# Patient Record
Sex: Male | Born: 1980 | Race: Black or African American | Hispanic: No | Marital: Single | Smoking: Current every day smoker
Health system: Southern US, Community
[De-identification: ages and names within clinical notes are randomized; demographics above are authoritative.]

## PROBLEM LIST (undated history)

## (undated) DIAGNOSIS — M1990 Unspecified osteoarthritis, unspecified site: Secondary | ICD-10-CM

## (undated) DIAGNOSIS — K219 Gastro-esophageal reflux disease without esophagitis: Secondary | ICD-10-CM

## (undated) HISTORY — PX: ANKLE SURGERY: SHX546

---

## 2005-10-05 ENCOUNTER — Emergency Department: Payer: Self-pay | Admitting: Emergency Medicine

## 2007-03-22 ENCOUNTER — Emergency Department: Payer: Self-pay | Admitting: Emergency Medicine

## 2009-02-25 ENCOUNTER — Emergency Department: Payer: Self-pay | Admitting: Unknown Physician Specialty

## 2010-10-01 ENCOUNTER — Emergency Department: Payer: Self-pay | Admitting: Emergency Medicine

## 2013-02-27 ENCOUNTER — Emergency Department: Payer: Self-pay | Admitting: Emergency Medicine

## 2013-02-27 LAB — URINALYSIS, COMPLETE
Bilirubin,UR: NEGATIVE
Ketone: NEGATIVE
Nitrite: NEGATIVE
Ph: 5 (ref 4.5–8.0)
RBC,UR: 461 /HPF (ref 0–5)
Specific Gravity: 1.023 (ref 1.003–1.030)
Squamous Epithelial: 2
WBC UR: 36 /HPF (ref 0–5)

## 2013-02-27 LAB — GC/CHLAMYDIA PROBE AMP

## 2013-08-07 ENCOUNTER — Emergency Department: Payer: Self-pay | Admitting: Emergency Medicine

## 2013-10-24 ENCOUNTER — Emergency Department: Payer: Self-pay | Admitting: Emergency Medicine

## 2014-03-20 ENCOUNTER — Emergency Department: Payer: Self-pay | Admitting: Emergency Medicine

## 2015-06-01 ENCOUNTER — Encounter: Payer: Self-pay | Admitting: *Deleted

## 2015-06-01 ENCOUNTER — Emergency Department
Admission: EM | Admit: 2015-06-01 | Discharge: 2015-06-01 | Disposition: A | Discharge status: Home / Self Care | Payer: Self-pay | Attending: Emergency Medicine | Admitting: Emergency Medicine

## 2015-06-01 DIAGNOSIS — K047 Periapical abscess without sinus: Secondary | ICD-10-CM | POA: Insufficient documentation

## 2015-06-01 DIAGNOSIS — K029 Dental caries, unspecified: Secondary | ICD-10-CM | POA: Insufficient documentation

## 2015-06-01 MED ORDER — AMOXICILLIN 875 MG PO TABS: 875.0000 mg | ORAL_TABLET | Freq: Two times a day (BID) | ORAL | Status: DC

## 2015-06-01 MED ORDER — MAGIC MOUTHWASH W/LIDOCAINE: 5.0000 mL | Freq: Four times a day (QID) | ORAL | Status: DC

## 2015-06-01 MED ORDER — LIDOCAINE-EPINEPHRINE 2 %-1:100000 IJ SOLN
1.7000 mL | Freq: Once | INTRAMUSCULAR | Status: DC
  Filled 2015-06-01: qty 1.7

## 2015-06-01 NOTE — ED Provider Notes (Signed)
[facility] Emergency Department Provider Note  ____________________________________________  Time seen: Approximately 11:03 AM  I have reviewed the triage vital signs and the nursing notes.   HISTORY  Chief Complaint Dental Pain    HPI Juan Best is a 35 y.o. male who presents emergency department complaining of left lower dental pain. Patient states that he has a "abscess" to that area. Patient does not have a dentist and has not taken any medications prior to arrival. Patient denies any fevers or chills, difficulty breathing or swallowing, headache, sore throat.    History reviewed. No pertinent past medical history.  There are no active problems to display for this patient.   No past surgical history on file.  Current Outpatient Rx  Name  Route  Sig  Dispense  Refill  . amoxicillin (AMOXIL) 875 MG tablet   Oral   Take 1 tablet (875 mg total) by mouth 2 (two) times daily.   14 tablet   0   . magic mouthwash w/lidocaine SOLN   Oral   Take 5 mLs by mouth 4 (four) times daily.   240 mL   0     Dispense in a 1/1/1/1 ratio. Use lidocaine, diphen ...     Allergies Sulfa antibiotics  History reviewed. No pertinent family history.  Social History Social History  Substance Use Topics  . Smoking status: None  . Smokeless tobacco: None  . Alcohol Use: None     Review of Systems  Constitutional: No fever/chills Eyes: No visual changes.  ENT: No sore throat. Positive for left lower dental pain. Cardiovascular: no chest pain. Respiratory: no cough. No SOB. Gastrointestinal: No abdominal pain.  No nausea, no vomiting.   Musculoskeletal: Negative for back pain. Skin: Negative for rash. Neurological: Negative for headaches, focal weakness or numbness. 10-point ROS otherwise negative.  ____________________________________________   PHYSICAL EXAM:  VITAL SIGNS: ED Triage Vitals  Enc Vitals Group     BP 06/01/15 1025 113/80  mmHg     Pulse Rate 06/01/15 1025 58     Resp 06/01/15 1025 18     Temp 06/01/15 1025 98.1 F (36.7 C)     Temp Source 06/01/15 1025 Oral     SpO2 06/01/15 1025 98 %     Weight 06/01/15 1025 140 lb (63.504 kg)     Height 06/01/15 1025  (1.753 m)     Head Cir --      Peak Flow --      Pain Score 06/01/15 1025 10     Pain Loc --      Pain Edu? --      Excl. in GC? --      Constitutional: Alert and oriented. Well appearing and in no acute distress. Eyes: Conjunctivae are normal. PERRL. EOMI. Head: Atraumatic. ENT:      Ears:       Nose: No congestion/rhinnorhea.      Mouth/Throat: Mucous membranes are moist. Poor dentition throughout with multiple caries identified. There is erythema and edema surrounding the #20 tooth. There is no fluctuance to palpation with tongue depressor. No drainage noted. Neck: No stridor.   Hematological/Lymphatic/Immunilogical: No cervical lymphadenopathy. Cardiovascular: Normal rate, regular rhythm. Normal S1 and S2.  Good peripheral circulation. Respiratory: Normal respiratory effort without tachypnea or retractions. Lungs CTAB. Neurologic:  Normal speech and language. No gross focal neurologic deficits are appreciated.  Skin:  Skin is warm, dry and intact. No rash noted. Psychiatric: Mood and affect are normal.  Speech and behavior are normal. Patient exhibits appropriate insight and judgement.   ____________________________________________   LABS (all labs ordered are listed, but only abnormal results are displayed)  Labs Reviewed - No data to display ____________________________________________  EKG   ____________________________________________  RADIOLOGY   No results found.  ____________________________________________    PROCEDURES  Procedure(s) performed:       Medications  lidocaine-EPINEPHrine (XYLOCAINE W/EPI) 2 %-1:100000 (with pres) injection 1.7 mL (not administered)      ____________________________________________   INITIAL IMPRESSION / ASSESSMENT AND PLAN / ED COURSE  Pertinent labs & imaging results that were available during my care of the patient were reviewed by me and considered in my medical decision making (see chart for details).  Patient's diagnosis is consistent with dental abscess. Patient will be discharged home with prescriptions for antibiotics and mouthwash. Patient is to follow up [facility] if symptoms persist past this treatment course. Patient is given ED precautions to return to the ED for any worsening or new symptoms.     ____________________________________________  FINAL CLINICAL IMPRESSION(S) / ED DIAGNOSES  Final diagnoses:  Dental abscess  Dental caries      NEW MEDICATIONS STARTED DURING THIS VISIT:  New Prescriptions   AMOXICILLIN (AMOXIL) 875 MG TABLET    Take 1 tablet (875 mg total) by mouth 2 (two) times daily.   MAGIC MOUTHWASH W/LIDOCAINE SOLN    Take 5 mLs by mouth 4 (four) times daily.        Delorise Royals Cuthriell, PA-C 06/01/15 1128  Jeanmarie Plant, MD 06/01/15 [phone]

## 2015-06-01 NOTE — Discharge Instructions (Signed)
Dental Abscess °A dental abscess is a collection of pus in or around a tooth. °CAUSES °This condition is caused by a bacterial infection around the root of the tooth that involves the inner part of the tooth (pulp). It may result from: °· Severe tooth decay. °· Trauma to the tooth that allows bacteria to enter into the pulp, such as a broken or chipped tooth. °· Severe gum disease around a tooth. °SYMPTOMS °Symptoms of this condition include: °· Severe pain in and around the infected tooth. °· Swelling and redness around the infected tooth, in the mouth, or in the face. °· Tenderness. °· Pus drainage. °· Bad breath. °· Bitter taste in the mouth. °· Difficulty swallowing. °· Difficulty opening the mouth. °· Nausea. °· Vomiting. °· Chills. °· Swollen neck glands. °· Fever. °DIAGNOSIS °This condition is diagnosed with examination of the infected tooth. During the exam, your dentist may tap on the infected tooth. Your dentist will also ask about your medical and dental history and may order X-rays. °TREATMENT °This condition is treated by eliminating the infection. This may be done with: °· Antibiotic medicine. °· A root canal. This may be performed to save the tooth. °· Pulling (extracting) the tooth. This may also involve draining the abscess. This is done if the tooth cannot be saved. °HOME CARE INSTRUCTIONS °· Take medicines only as directed by your dentist. °· If you were prescribed antibiotic medicine, finish all of it even if you start to feel better. °· Rinse your mouth (gargle) often with salt water to relieve pain or swelling. °· Do not drive or operate heavy machinery while taking pain medicine. °· Do not apply heat to the outside of your mouth. °· Keep all follow-up visits as directed by your dentist. This is important. °SEEK MEDICAL CARE IF: °· Your pain is worse and is not helped by medicine. °SEEK IMMEDIATE MEDICAL CARE IF: °· You have a fever or chills. °· Your symptoms suddenly get worse. °· You have a  very bad headache. °· You have problems breathing or swallowing. °· You have trouble opening your mouth. °· You have swelling in your neck or around your eye. °  °This information is not intended to replace advice given to you by your health care provider. Make sure you discuss any questions you have with your health care provider. °  °Document Released: 04/14/2005 Document Revised: 08/29/2014 Document Reviewed: 04/11/2014 °Elsevier Interactive Patient Education ©2016 [employer] ° °Dental Care and Dentist Visits °Dental care supports good overall health. Regular dental visits can also help you avoid dental pain, bleeding, infection, and other more serious health problems in the future. It is important to keep the mouth healthy because diseases in the teeth, gums, and other oral tissues can spread to other areas of the body. Some problems, such as diabetes, heart disease, and pre-term labor have been associated with poor oral health.  °See your dentist every 6 months. If you experience emergency problems such as a toothache or broken tooth, go to the dentist right away. If you see your dentist regularly, you may catch problems early. It is easier to be treated for problems in the early stages.  °WHAT TO EXPECT AT A DENTIST VISIT  °Your dentist will look for many common oral health problems and recommend proper treatment. At your regular dental visit, you can expect: °· Gentle cleaning of the teeth and gums. This includes scraping and polishing. This helps to remove the sticky substance around the teeth and gums (  plaque). Plaque forms in the mouth shortly after eating. Over time, plaque hardens on the teeth as tartar. If tartar is not removed regularly, it can cause problems. Cleaning also helps remove stains. °· Periodic X-rays. These pictures of the teeth and supporting bone will help your dentist assess the health of your teeth. °· Periodic fluoride treatments. Fluoride is a natural mineral shown to help  strengthen teeth. Fluoride treatment involves applying a fluoride gel or varnish to the teeth. It is most commonly done in children. °· Examination of the mouth, tongue, jaws, teeth, and gums to look for any oral health problems, such as: °¨ Cavities (dental caries). This is decay on the tooth caused by plaque, sugar, and acid in the mouth. It is best to catch a cavity when it is small. °¨ Inflammation of the gums caused by plaque buildup (gingivitis). °¨ Problems with the mouth or malformed or misaligned teeth. °¨ Oral cancer or other diseases of the soft tissues or jaws.  °KEEP YOUR TEETH AND GUMS HEALTHY °For healthy teeth and gums, follow these general guidelines as well as your dentist's specific advice: °· Have your teeth professionally cleaned at the dentist every 6 months. °· Brush twice daily with a fluoride toothpaste. °· Floss your teeth daily.  °· Ask your dentist if you need fluoride supplements, treatments, or fluoride toothpaste. °· Eat a healthy diet. Reduce foods and drinks with added sugar. °· Avoid smoking. °TREATMENT FOR ORAL HEALTH PROBLEMS °If you have oral health problems, treatment varies depending on the conditions present in your teeth and gums. °· Your caregiver will most likely recommend good oral hygiene at each visit. °· For cavities, gingivitis, or other oral health disease, your caregiver will perform a procedure to treat the problem. This is typically done at a separate appointment. Sometimes your caregiver will refer you to another dental specialist for specific tooth problems or for surgery. °SEEK IMMEDIATE DENTAL CARE IF: °· You have pain, bleeding, or soreness in the gum, tooth, jaw, or mouth area. °· A permanent tooth becomes loose or separated from the gum socket. °· You experience a blow or injury to the mouth or jaw area. °  °This information is not intended to replace advice given to you by your health care provider. Make sure you discuss any questions you have with your  health care provider. °  °Document Released: 12/25/2010 Document Revised: 07/07/2011 Document Reviewed: 12/25/2010 °Elsevier Interactive Patient Education ©2016 [employer] ° °

## 2015-06-01 NOTE — ED Notes (Signed)
Pt states dental abscess on the left side of his mouth

## 2015-07-09 ENCOUNTER — Encounter: Payer: Self-pay | Admitting: *Deleted

## 2015-07-09 ENCOUNTER — Emergency Department
Admission: EM | Admit: 2015-07-09 | Discharge: 2015-07-09 | Disposition: A | Discharge status: Home / Self Care | Payer: Self-pay | Attending: Emergency Medicine | Admitting: Emergency Medicine

## 2015-07-09 DIAGNOSIS — J069 Acute upper respiratory infection, unspecified: Secondary | ICD-10-CM | POA: Insufficient documentation

## 2015-07-09 DIAGNOSIS — A084 Viral intestinal infection, unspecified: Secondary | ICD-10-CM | POA: Insufficient documentation

## 2015-07-09 DIAGNOSIS — F17210 Nicotine dependence, cigarettes, uncomplicated: Secondary | ICD-10-CM | POA: Insufficient documentation

## 2015-07-09 LAB — RAPID INFLUENZA A&B ANTIGENS: Influenza A (ARMC): NEGATIVE

## 2015-07-09 LAB — RAPID INFLUENZA A&B ANTIGENS (ARMC ONLY): INFLUENZA B (ARMC): NEGATIVE

## 2015-07-09 MED ORDER — PROMETHAZINE-DM 6.25-15 MG/5ML PO SYRP: 5.0000 mL | ORAL_SOLUTION | Freq: Four times a day (QID) | ORAL | Status: DC | PRN

## 2015-07-09 MED ORDER — ONDANSETRON 4 MG PO TBDP
4.0000 mg | ORAL_TABLET | Freq: Once | ORAL | Status: AC
  Administered 2015-07-09: 4 mg via ORAL
  Filled 2015-07-09: qty 1

## 2015-07-09 NOTE — ED Notes (Signed)
Strep negative.

## 2015-07-09 NOTE — ED Provider Notes (Signed)
[facility] Emergency Department Provider Note  ____________________________________________  Time seen: Approximately 11:19 AM  I have reviewed the triage vital signs and the nursing notes.   HISTORY  Chief Complaint Emesis    HPI Juan Best is a 35 y.o. male , NAD, presents to the emergency department with a history of nausea and vomiting. Has had some cough and congestion. She is been exposed to children who have had a stomach virus over the last week. Is not taking anything over-the-counter for his current symptoms. No change in changes in urination. No chest or back pain. Has not had any headaches, shortness of breath, wheezing. Has been able to eat and drink well. Does note he has a physically intensive job and needs a work note for today.   History reviewed. No pertinent past medical history.  There are no active problems to display for this patient.   History reviewed. No pertinent past surgical history.  Current Outpatient Rx  Name  Route  Sig  Dispense  Refill  . promethazine-dextromethorphan (PROMETHAZINE-DM) 6.25-15 MG/5ML syrup   Oral   Take 5 mLs by mouth 4 (four) times daily as needed for cough.   118 mL   0     Allergies Sulfa antibiotics  No family history on file.  Social History Social History  Substance Use Topics  . Smoking status: Current Every Day Smoker -- 0.50 packs/day    Types: Cigarettes  . Smokeless tobacco: None  . Alcohol Use: No     Review of Systems  Constitutional: Positive fatigue. No fever/chills Eyes: No visual changes. No discharge ENT: No sore throat or nasal congestion, runny nose, ear pain. Cardiovascular: No chest pain. Respiratory: Positive chest congestion, cough. No shortness of breath. No wheezing.  Gastrointestinal: Positive nausea, vomiting. No abdominal pain.   No diarrhea.  No constipation. Genitourinary: Negative for dysuria.  Musculoskeletal: Negative for back pain.  Skin:  Negative for rash. Neurological: Negative for headaches, focal weakness or numbness. 10-point ROS otherwise negative.  ____________________________________________   PHYSICAL EXAM:  VITAL SIGNS: ED Triage Vitals  Enc Vitals Group     BP 07/09/15 1044 121/75 mmHg     Pulse Rate 07/09/15 1044 110     Resp 07/09/15 1044 22     Temp 07/09/15 1044 100.9 F (38.3 C)     Temp Source 07/09/15 1044 Oral     SpO2 07/09/15 1044 96 %     Weight 07/09/15 1044 140 lb (63.504 kg)     Height 07/09/15 1044  (1.753 m)     Head Cir --      Peak Flow --      Pain Score --      Pain Loc --      Pain Edu? --      Excl. in GC? --     Constitutional: Alert and oriented. Well appearing and in no acute distress. Eyes: Conjunctivae are normal.  Head: Atraumatic. ENT:      Ears: TMs visualized bilaterally without effusion, erythema, bulging, perforation.      Nose: Mild congestion/rhinnorhea.      Mouth/Throat: Mucous membranes are moist. Pharynx with trace injection but no erythema, exudate, swelling. Neck: Upper with full range of motion. Hematological/Lymphatic/Immunilogical: No cervical lymphadenopathy. Cardiovascular: Normal rate, regular rhythm. Normal S1 and S2.  Good peripheral circulation. Respiratory: Normal respiratory effort without tachypnea or retractions. Lungs CTAB. Gastrointestinal: Soft and nontender in all quadrants. No distention, guarding. Bowel sounds grossly normal in  all quadrants. Neurologic:  Normal speech and language. No gross focal neurologic deficits are appreciated.  Skin:  Skin turgor is normal. Skin is warm, dry and intact. No rash noted. Psychiatric: Mood and affect are normal. Speech and behavior are normal. Patient exhibits appropriate insight and judgement.   ____________________________________________   LABS (all labs ordered are listed, but only abnormal results are displayed)  Labs Reviewed  RAPID INFLUENZA A&B ANTIGENS ([facility] ONLY)    ____________________________________________  EKG  None ____________________________________________  RADIOLOGY  None ____________________________________________    PROCEDURES  Procedure(s) performed: None    Medications  ondansetron (ZOFRAN-ODT) disintegrating tablet 4 mg (4 mg Oral Given 07/09/15 1127)     ____________________________________________   INITIAL IMPRESSION / ASSESSMENT AND PLAN / ED COURSE  Pertinent lab results that were available during my care of the patient were reviewed by me and considered in my medical decision making (see chart for details).  Patient's diagnosis is consistent with viral gastroenteritis and viral respiratory infection. Patient will be discharged home with prescriptions for promethazine DM cough syrup to take as directed. May take over-the-counter Tylenol as needed. Patient was given a work note to excuse for today. Advise patient to drink water and Gatorade through the day to increase hydration. [facility] if symptoms persist past this treatment course. Patient is given ED precautions to return to the ED for any worsening or new symptoms.    ____________________________________________  FINAL CLINICAL IMPRESSION(S) / ED DIAGNOSES  Final diagnoses:  Viral gastroenteritis  Viral upper respiratory infection      NEW MEDICATIONS STARTED DURING THIS VISIT:  New Prescriptions   PROMETHAZINE-DEXTROMETHORPHAN (PROMETHAZINE-DM) 6.25-15 MG/5ML SYRUP    Take 5 mLs by mouth 4 (four) times daily as needed for cough.         Hope PigeonJami L Hagler, PA-C 07/09/15 1215  Jene Everyobert Kinner, MD 07/09/15 1215

## 2015-07-09 NOTE — ED Notes (Signed)
Pt reports vomiting four times since today at 0300, pt reports chills and children have the flu

## 2015-07-09 NOTE — Discharge Instructions (Signed)
Food Choices to Help Relieve Diarrhea, Adult °When you have diarrhea, the foods you eat and your eating habits are very important. Choosing the right foods and drinks can help relieve diarrhea. Also, because diarrhea can last up to 7 days, you need to replace lost fluids and electrolytes (such as sodium, potassium, and chloride) in order to help prevent dehydration.  °WHAT GENERAL GUIDELINES DO I NEED TO FOLLOW? °· Slowly drink 1 cup (8 oz) of fluid for each episode of diarrhea. If you are getting enough fluid, your urine will be clear or pale yellow. °· Eat starchy foods. Some good choices include white rice, white toast, pasta, low-fiber cereal, baked potatoes (without the skin), saltine crackers, and bagels. °· Avoid large servings of any cooked vegetables. °· Limit fruit to two servings per day. A serving is ½ cup or 1 small piece. °· Choose foods with less than 2 g of fiber per serving. °· Limit fats to less than 8 tsp (38 g) per day. °· Avoid fried foods. °· Eat foods that have probiotics in them. Probiotics can be found in certain dairy products. °· Avoid foods and beverages that may increase the speed at which food moves through the stomach and intestines (gastrointestinal tract). Things to avoid include: °· High-fiber foods, such as dried fruit, raw fruits and vegetables, nuts, seeds, and whole grain foods. °· Spicy foods and high-fat foods. °· Foods and beverages sweetened with high-fructose corn syrup, honey, or sugar alcohols such as xylitol, sorbitol, and mannitol. °WHAT FOODS ARE RECOMMENDED? °Grains °White rice. White, French, or pita breads (fresh or toasted), including plain rolls, buns, or bagels. White pasta. Saltine, soda, or graham crackers. Pretzels. Low-fiber cereal. Cooked cereals made with water (such as cornmeal, farina, or cream cereals). Plain muffins. Matzo. Melba toast. Zwieback.  °Vegetables °Potatoes (without the skin). Strained tomato and vegetable juices. Most well-cooked and canned  vegetables without seeds. Tender lettuce. °Fruits °Cooked or canned applesauce, apricots, cherries, fruit cocktail, grapefruit, peaches, pears, or plums. Fresh bananas, apples without skin, cherries, grapes, cantaloupe, grapefruit, peaches, oranges, or plums.  °Meat and Other Protein Products °Baked or boiled chicken. Eggs. Tofu. Fish. Seafood. Smooth peanut butter. Ground or well-cooked tender beef, ham, veal, lamb, pork, or poultry.  °Dairy °Plain yogurt, kefir, and unsweetened liquid yogurt. Lactose-free milk, buttermilk, or soy milk. Plain hard cheese. °Beverages °Sport drinks. Clear broths. Diluted fruit juices (except prune). Regular, caffeine-free sodas such as ginger ale. Water. Decaffeinated teas. Oral rehydration solutions. Sugar-free beverages not sweetened with sugar alcohols. °Other °Bouillon, broth, or soups made from recommended foods.  °The items listed above may not be a complete list of recommended foods or beverages. Contact your dietitian for more options. °WHAT FOODS ARE NOT RECOMMENDED? °Grains °Whole grain, whole wheat, bran, or rye breads, rolls, pastas, crackers, and cereals. Wild or brown rice. Cereals that contain more than 2 g of fiber per serving. Corn tortillas or taco shells. Cooked or dry oatmeal. Granola. Popcorn. °Vegetables °Raw vegetables. Cabbage, broccoli, Brussels sprouts, artichokes, baked beans, beet greens, corn, kale, legumes, peas, sweet potatoes, and yams. Potato skins. Cooked spinach and cabbage. °Fruits °Dried fruit, including raisins and dates. Raw fruits. Stewed or dried prunes. Fresh apples with skin, apricots, mangoes, pears, raspberries, and strawberries.  °Meat and Other Protein Products °Chunky peanut butter. Nuts and seeds. Beans and lentils. Bacon.  °Dairy °High-fat cheeses. Milk, chocolate milk, and beverages made with milk, such as milk shakes. Cream. Ice cream. °Sweets and Desserts °Sweet rolls, doughnuts, and sweet breads.   Pancakes and waffles. Fats and  Oils Butter. Cream sauces. Margarine. Salad oils. Plain salad dressings. Olives. Avocados.  Beverages Caffeinated beverages (such as coffee, tea, soda, or energy drinks). Alcoholic beverages. Fruit juices with pulp. Prune juice. Soft drinks sweetened with high-fructose corn syrup or sugar alcohols. Other Coconut. Hot sauce. Chili powder. Mayonnaise. Gravy. Cream-based or milk-based soups.  The items listed above may not be a complete list of foods and beverages to avoid. Contact your dietitian for more information. WHAT SHOULD I DO IF I BECOME DEHYDRATED? Diarrhea can sometimes lead to dehydration. Signs of dehydration include dark urine and dry mouth and skin. If you think you are dehydrated, you should rehydrate with an oral rehydration solution. These solutions can be purchased at pharmacies, retail stores, or online.  Drink -1 cup (120-240 mL) of oral rehydration solution each time you have an episode of diarrhea. If drinking this amount makes your diarrhea worse, try drinking smaller amounts more often. For example, drink 1-3 tsp (5-15 mL) every 5-10 minutes.  A general rule for staying hydrated is to drink 1-2 L of fluid per day. Talk to your health care provider about the specific amount you should be drinking each day. Drink enough fluids to keep your urine clear or pale yellow.   This information is not intended to replace advice given to you by your health care provider. Make sure you discuss any questions you have with your health care provider.   Document Released: 07/05/2003 Document Revised: 05/05/2014 Document Reviewed: 03/07/2013 Elsevier Interactive Patient Education 2016 [employer] Inc[employer]  Viral Gastroenteritis Viral gastroenteritis is also called stomach flu. This illness is caused by a certain type of germ (virus). It can cause sudden watery poop (diarrhea) and throwing up (vomiting). This can cause you to lose body fluids (dehydration). This illness usually lasts for 3 to 8  days. It usually goes away on its own. HOME CARE   Drink enough fluids to keep your pee (urine) clear or pale yellow. Drink small amounts of fluids often.  Ask your doctor how to replace body fluid losses (rehydration).  Avoid:  Foods high in sugar.  Alcohol.  Bubbly (carbonated) drinks.  Tobacco.  Juice.  Caffeine drinks.  Very hot or cold fluids.  Fatty, greasy foods.  Eating too much at one time.  Dairy products until 24 to 48 hours after your watery poop stops.  You may eat foods with active cultures (probiotics). They can be found in some yogurts and supplements.  Wash your hands well to avoid spreading the illness.  Only take medicines as told by your doctor. Do not give aspirin to children. Do not take medicines for watery poop (antidiarrheals).  Ask your doctor if you should keep taking your regular medicines.  Keep all doctor visits as told. GET HELP RIGHT AWAY IF:   You cannot keep fluids down.  You do not pee at least once every 6 to 8 hours.  You are short of breath.  You see blood in your poop or throw up. This may look like coffee grounds.  You have belly (abdominal) pain that gets worse or is just in one small spot (localized).  You keep throwing up or having watery poop.  You have a fever.  The patient is a child younger than 3 months, and he or she has a fever.  The patient is a child older than 3 months, and he or she has a fever and problems that do not go away.  The patient  a child older than 3 months, and he or she has a fever and problems that suddenly get worse.  The patient is a baby, and he or she has no tears when crying. MAKE SURE YOU:   Understand these instructions.  Will watch your condition.  Will get help right away if you are not doing well or get worse.   This information is not intended to replace advice given to you by your health care provider. Make sure you discuss any questions you have with your health care  provider.   Document Released: 10/01/2007 Document Revised: 07/07/2011 Document Reviewed: 01/29/2011 Elsevier Interactive Patient Education 2016 Elsevier Inc[employer] Viral Infections A virus is a type of germ. Viruses can cause:  Minor sore throats.  Aches and pains.  Headaches.  Runny nose.  Rashes.  Watery eyes.  Tiredness.  Coughs.  Loss of appetite.  Feeling sick to your stomach (nausea).  Throwing up (vomiting).  Watery poop (diarrhea). HOME CARE   Only take medicines as told by your doctor.  Drink enough water and fluids to keep your pee (urine) clear or pale yellow. Sports drinks are a good choice.  Get plenty of rest and eat healthy. Soups and broths with crackers or rice are fine. GET HELP RIGHT AWAY IF:   You have a very bad headache.  You have shortness of breath.  You have chest pain or neck pain.  You have an unusual rash.  You cannot stop throwing up.  You have watery poop that does not stop.  You cannot keep fluids down.  You or your child has a temperature by mouth above 102 F (38.9 C), not controlled by medicine.  Your baby is older than 3 months with a rectal temperature of 102 F (38.9 C) or higher.  Your baby is 3 months old or younger with a rectal temperature of 100.4 F (38 C) or higher. MAKE SURE YOU:   Understand these instructions.  Will watch this condition.  Will get help right away if you are not doing well or get worse.   This information is not intended to replace advice given to you by your health care provider. Make sure you discuss any questions you have with your health care provider.   Document Released: 03/27/2008 Document Revised: 07/07/2011 Document Reviewed: 09/20/2014 Elsevier Interactive Patient Education 2016 Elsevier Inc.  

## 2015-09-27 ENCOUNTER — Encounter: Payer: Self-pay | Admitting: Emergency Medicine

## 2015-09-27 ENCOUNTER — Emergency Department
Admission: EM | Admit: 2015-09-27 | Discharge: 2015-09-28 | Disposition: A | Discharge status: Home / Self Care | Payer: Self-pay | Attending: Emergency Medicine | Admitting: Emergency Medicine

## 2015-09-27 ENCOUNTER — Emergency Department: Payer: Self-pay

## 2015-09-27 DIAGNOSIS — R079 Chest pain, unspecified: Secondary | ICD-10-CM | POA: Insufficient documentation

## 2015-09-27 DIAGNOSIS — F17210 Nicotine dependence, cigarettes, uncomplicated: Secondary | ICD-10-CM | POA: Insufficient documentation

## 2015-09-27 LAB — BASIC METABOLIC PANEL
Anion gap: 5 (ref 5–15)
BUN: 13 mg/dL (ref 6–20)
CO2: 33 mmol/L — ABNORMAL HIGH (ref 22–32)
Calcium: 9.6 mg/dL (ref 8.9–10.3)
Chloride: 101 mmol/L (ref 101–111)
Creatinine, Ser: 1.16 mg/dL (ref 0.61–1.24)
GFR calc Af Amer: >60 mL/min (ref >60)
GFR calc non Af Amer: >60 mL/min (ref >60)
Glucose, Bld: 101 mg/dL — ABNORMAL HIGH (ref 65–99)
Potassium: 4.5 mmol/L (ref 3.5–5.1)
Sodium: 139 mmol/L (ref 135–145)

## 2015-09-27 LAB — CBC
HCT: 40 % (ref 40.0–52.0)
Hemoglobin: 13.5 g/dL (ref 13.0–18.0)
MCH: 28.4 pg (ref 26.0–34.0)
MCHC: 33.8 g/dL (ref 32.0–36.0)
MCV: 83.9 fL (ref 80.0–100.0)
Platelets: 195 10*3/uL (ref 150–440)
RBC: 4.76 MIL/uL (ref 4.40–5.90)
RDW: 14.3 % (ref 11.5–14.5)
WBC: 4.3 10*3/uL (ref 3.8–10.6)

## 2015-09-27 LAB — TROPONIN I

## 2015-09-27 NOTE — ED Notes (Addendum)
Pt reports he is having chest pain since 4p today - Pt states that the tightness was in mid chest but at this the pain has stopped - Pt reports he had a severe episode of heartburn and this feels the same just worse - Pt reports the chest pain came on after drinking an energy drink

## 2015-09-27 NOTE — ED Notes (Signed)
Pt presents to ED with c/o center chest pain, onset today while at work. Pt denies heavy lifting but states he was carry objects at work when felt sharp/tight pain in his central chest. Denies other symptoms.

## 2015-09-27 NOTE — ED Provider Notes (Signed)
[facility] Emergency Department Provider Note  ____________________________________________  Time seen: 11:30 PM  I have reviewed the triage vital signs and the nursing notes.   HISTORY  Chief Complaint Chest Pain    HPI Juan Best is a 35 y.o. male presents with history of acute onset of chest pain heart racing status post drinking a monster energy drink at 4 PM this afternoon. Patient admits to diaphoresis denies any dizziness no nausea or vomiting. Patient states the symptoms have since completely resolved.    Past medical history None There are no active problems to display for this patient.  Past surgical history None  Current Outpatient Rx  Name  Route  Sig  Dispense  Refill  . promethazine-dextromethorphan (PROMETHAZINE-DM) 6.25-15 MG/5ML syrup   Oral   Take 5 mLs by mouth 4 (four) times daily as needed for cough.   118 mL   0     Allergies Sulfa antibiotics  History reviewed. No pertinent family history.  Social History Social History  Substance Use Topics  . Smoking status: Current Every Day Smoker -- 0.50 packs/day    Types: Cigarettes  . Smokeless tobacco: None  . Alcohol Use: No    Review of Systems  Constitutional: Negative for fever. Eyes: Negative for visual changes. ENT: Negative for sore throat. Cardiovascular: Negative for chest pain. Respiratory: Negative for shortness of breath. Gastrointestinal: Negative for abdominal pain, vomiting and diarrhea. Genitourinary: Negative for dysuria. Musculoskeletal: Negative for back pain. Skin: Negative for rash. Neurological: Negative for headaches, focal weakness or numbness.   10-point ROS otherwise negative.  ____________________________________________   PHYSICAL EXAM:  VITAL SIGNS: ED Triage Vitals  Enc Vitals Group     BP 09/27/15 2132 107/79 mmHg     Pulse Rate 09/27/15 2132 68     Resp 09/27/15 2132 18     Temp 09/27/15 2132 98.2 F (36.8 C)   Temp Source 09/27/15 2132 Oral     SpO2 09/27/15 2132 100 %     Weight 09/27/15 2132 140 lb (63.504 kg)     Height 09/27/15 2132  (1.753 m)     Head Cir --      Peak Flow --      Pain Score 09/27/15 2149 8     Pain Loc --      Pain Edu? --      Excl. in GC? --      Constitutional: Alert and oriented. Well appearing and in no distress. Eyes: Conjunctivae are normal. PERRL. Normal extraocular movements. ENT   Head: Normocephalic and atraumatic.   Nose: No congestion/rhinnorhea.   Mouth/Throat: Mucous membranes are moist.   Neck: No stridor. Hematological/Lymphatic/Immunilogical: No cervical lymphadenopathy. Cardiovascular: Normal rate, regular rhythm. Normal and symmetric distal pulses are present in all extremities. No murmurs, rubs, or gallops. Respiratory: Normal respiratory effort without tachypnea nor retractions. Breath sounds are clear and equal bilaterally. No wheezes/rales/rhonchi. Gastrointestinal: Soft and nontender. No distention. There is no CVA tenderness. Genitourinary: deferred Musculoskeletal: Nontender with normal range of motion in all extremities. No joint effusions.  No lower extremity tenderness nor edema. Neurologic:  Normal speech and language. No gross focal neurologic deficits are appreciated. Speech is normal.  Skin:  Skin is warm, dry and intact. No rash noted. Psychiatric: Mood and affect are normal. Speech and behavior are normal. Patient exhibits appropriate insight and judgment.  ____________________________________________    LABS (pertinent positives/negatives)  Labs Reviewed  BASIC METABOLIC PANEL - Abnormal; Notable for the following:  CO2 33 (*)    Glucose, Bld 101 (*)    All other components within normal limits  CBC  TROPONIN I  TROPONIN I     ____________________________________________   EKG  ED ECG REPORT I, [place] N Malana Eberwein, the attending physician, personally viewed and interpreted this ECG.   Date:  09/28/2015  EKG Time: 9:38 PM  Rate: 56  Rhythm sinus bradycardia  Axis: Normal  Intervals: Normal  ST&T Change: None   ____________________________________________    RADIOLOGY   DG Chest 2 View (Final result) Result time: 09/27/15 22:12:47   Final result by Rad Results In Interface (09/27/15 22:12:47)   Narrative:   CLINICAL DATA: Acute onset of central chest pain. Initial encounter.  EXAM: CHEST 2 VIEW  COMPARISON: Chest radiograph performed 02/26/2009  FINDINGS: The lungs are well-aerated and clear. There is no evidence of focal opacification, pleural effusion or pneumothorax.  The heart is normal in size; the mediastinal contour is within normal limits. No acute osseous abnormalities are seen.  IMPRESSION: No acute cardiopulmonary process seen.   Electronically Signed By: Roanna RaiderJeffery Chang M.D. On: 09/27/2015 22:12              INITIAL IMPRESSION / ASSESSMENT AND PLAN / ED COURSE  Pertinent labs & imaging results that were available during my care of the patient were reviewed by me and considered in my medical decision making (see chart for details).  Patient remained chest pain-free since time of evaluation. Laboratory data including troponin 2 unremarkable. Chest x-ray negative.  ____________________________________________   FINAL CLINICAL IMPRESSION(S) / ED DIAGNOSES  Final diagnoses:  Chest pain, unspecified chest pain type      Darci Currentandolph N Tressie Ragin, MD 09/28/15 [id number]

## 2015-09-28 LAB — TROPONIN I: Troponin I: <0.03 ng/mL (ref <0.031)

## 2015-09-28 NOTE — Discharge Instructions (Signed)
Nonspecific Chest Pain  °Chest pain can be caused by many different conditions. There is always a chance that your pain could be related to something serious, such as a heart attack or a blood clot in your lungs. Chest pain can also be caused by conditions that are not life-threatening. If you have chest pain, it is very important to follow up with your health care provider. °CAUSES  °Chest pain can be caused by: °· Heartburn. °· Pneumonia or bronchitis. °· Anxiety or stress. °· Inflammation around your heart (pericarditis) or lung (pleuritis or pleurisy). °· A blood clot in your lung. °· A collapsed lung (pneumothorax). It can develop suddenly on its own (spontaneous pneumothorax) or from trauma to the chest. °· Shingles infection (varicella-zoster virus). °· Heart attack. °· Damage to the bones, muscles, and cartilage that make up your chest wall. This can include: °¨ Bruised bones due to injury. °¨ Strained muscles or cartilage due to frequent or repeated coughing or overwork. °¨ Fracture to one or more ribs. °¨ Sore cartilage due to inflammation (costochondritis). °RISK FACTORS  °Risk factors for chest pain may include: °· Activities that increase your risk for trauma or injury to your chest. °· Respiratory infections or conditions that cause frequent coughing. °· Medical conditions or overeating that can cause heartburn. °· Heart disease or family history of heart disease. °· Conditions or health behaviors that increase your risk of developing a blood clot. °· Having had chicken pox (varicella zoster). °SIGNS AND SYMPTOMS °Chest pain can feel like: °· Burning or tingling on the surface of your chest or deep in your chest. °· Crushing, pressure, aching, or squeezing pain. °· Dull or sharp pain that is worse when you move, cough, or take a deep breath. °· Pain that is also felt in your back, neck, shoulder, or arm, or pain that spreads to any of these areas. °Your chest pain may come and go, or it may stay  constant. °DIAGNOSIS °Lab tests or other studies may be needed to find the cause of your pain. Your health care provider may have you take a test called an ambulatory ECG (electrocardiogram). An ECG records your heartbeat patterns at the time the test is performed. You may also have other tests, such as: °· Transthoracic echocardiogram (TTE). During echocardiography, sound waves are used to create a picture of all of the heart structures and to look at how blood flows through your heart. °· Transesophageal echocardiogram (TEE). This is a more advanced imaging test that obtains images from inside your body. It allows your health care provider to see your heart in finer detail. °· Cardiac monitoring. This allows your health care provider to monitor your heart rate and rhythm in real time. °· Holter monitor. This is a portable device that records your heartbeat and can help to diagnose abnormal heartbeats. It allows your health care provider to track your heart activity for several days, if needed. °· Stress tests. These can be done through exercise or by taking medicine that makes your heart beat more quickly. °· Blood tests. °· Imaging tests. °TREATMENT  °Your treatment depends on what is causing your chest pain. Treatment may include: °· Medicines. These may include: °¨ Acid blockers for heartburn. °¨ Anti-inflammatory medicine. °¨ Pain medicine for inflammatory conditions. °¨ Antibiotic medicine, if an infection is present. °¨ Medicines to dissolve blood clots. °¨ Medicines to treat coronary artery disease. °· Supportive care for conditions that do not require medicines. This may include: °¨ Resting. °¨ Applying heat   or cold packs to injured areas. °¨ Limiting activities until pain decreases. °HOME CARE INSTRUCTIONS °· If you were prescribed an antibiotic medicine, finish it all even if you start to feel better. °· Avoid any activities that bring on chest pain. °· Do not use any tobacco products, including  cigarettes, chewing tobacco, or electronic cigarettes. If you need help quitting, ask your health care provider. °· Do not drink alcohol. °· Take medicines only as directed by your health care provider. °· Keep all follow-up visits as directed by your health care provider. This is important. This includes any further testing if your chest pain does not go away. °· If heartburn is the cause for your chest pain, you may be told to keep your head raised (elevated) while sleeping. This reduces the chance that acid will go from your stomach into your esophagus. °· Make lifestyle changes as directed by your health care provider. These may include: °¨ Getting regular exercise. Ask your health care provider to suggest some activities that are safe for you. °¨ Eating a heart-healthy diet. A registered dietitian can help you to learn healthy eating options. °¨ Maintaining a healthy weight. °¨ Managing diabetes, if necessary. °¨ Reducing stress. °SEEK MEDICAL CARE IF: °· Your chest pain does not go away after treatment. °· You have a rash with blisters on your chest. °· You have a fever. °SEEK IMMEDIATE MEDICAL CARE IF:  °· Your chest pain is worse. °· You have an increasing cough, or you cough up blood. °· You have severe abdominal pain. °· You have severe weakness. °· You faint. °· You have chills. °· You have sudden, unexplained chest discomfort. °· You have sudden, unexplained discomfort in your arms, back, neck, or jaw. °· You have shortness of breath at any time. °· You suddenly start to sweat, or your skin gets clammy. °· You feel nauseous or you vomit. °· You suddenly feel light-headed or dizzy. °· Your heart begins to beat quickly, or it feels like it is skipping beats. °These symptoms may represent a serious problem that is an emergency. Do not wait to see if the symptoms will go away. Get medical help right away. Call your local emergency services (911 in the [place] Do not drive yourself to the hospital. °  °This  information is not intended to replace advice given to you by your health care provider. Make sure you discuss any questions you have with your health care provider. °  °Document Released: 01/22/2005 Document Revised: 05/05/2014 Document Reviewed: 11/18/2013 °Elsevier Interactive Patient Education ©2016 Elsevier Inc. ° °

## 2015-09-28 NOTE — ED Notes (Signed)
Troponin redrawn and sent to lab

## 2016-12-02 ENCOUNTER — Emergency Department: Payer: Self-pay

## 2016-12-02 ENCOUNTER — Encounter: Payer: Self-pay | Admitting: Emergency Medicine

## 2016-12-02 ENCOUNTER — Emergency Department
Admission: EM | Admit: 2016-12-02 | Discharge: 2016-12-02 | Disposition: A | Discharge status: Home / Self Care | Payer: Self-pay | Attending: Emergency Medicine | Admitting: Emergency Medicine

## 2016-12-02 DIAGNOSIS — R0789 Other chest pain: Secondary | ICD-10-CM | POA: Insufficient documentation

## 2016-12-02 DIAGNOSIS — F17210 Nicotine dependence, cigarettes, uncomplicated: Secondary | ICD-10-CM | POA: Insufficient documentation

## 2016-12-02 DIAGNOSIS — F419 Anxiety disorder, unspecified: Secondary | ICD-10-CM | POA: Insufficient documentation

## 2016-12-02 LAB — BASIC METABOLIC PANEL
Anion gap: 8 (ref 5–15)
BUN: 15 mg/dL (ref 6–20)
CHLORIDE: 100 mmol/L — AB (ref 101–111)
CO2: 29 mmol/L (ref 22–32)
CREATININE: 1.16 mg/dL (ref 0.61–1.24)
Calcium: 9.5 mg/dL (ref 8.9–10.3)
GFR calc Af Amer: >60 mL/min (ref >60)
GFR calc non Af Amer: >60 mL/min (ref >60)
Glucose, Bld: 103 mg/dL — ABNORMAL HIGH (ref 65–99)
POTASSIUM: 4 mmol/L (ref 3.5–5.1)
Sodium: 137 mmol/L (ref 135–145)

## 2016-12-02 LAB — CBC
HEMATOCRIT: 39.8 % — AB (ref 40.0–52.0)
Hemoglobin: 13.4 g/dL (ref 13.0–18.0)
MCH: 29.2 pg (ref 26.0–34.0)
MCHC: 33.7 g/dL (ref 32.0–36.0)
MCV: 86.6 fL (ref 80.0–100.0)
PLATELETS: 210 10*3/uL (ref 150–440)
RBC: 4.59 MIL/uL (ref 4.40–5.90)
RDW: 14.8 % — AB (ref 11.5–14.5)
WBC: 5.5 10*3/uL (ref 3.8–10.6)

## 2016-12-02 LAB — TROPONIN I: Troponin I: <0.03 ng/mL (ref <0.03)

## 2016-12-02 MED ORDER — RANITIDINE HCL 150 MG PO CAPS: 150.0000 mg | ORAL_CAPSULE | Freq: Two times a day (BID) | ORAL | 0 refills | Status: DC

## 2016-12-02 NOTE — ED Notes (Signed)
Patient requesting work note. Patient also states, "If I get a drug test can my work see that? Someone at my job said they can look it up". Patient educated on HIPPA regulations.

## 2016-12-02 NOTE — Discharge Instructions (Signed)
Results for orders placed or performed during the hospital encounter of 12/02/16  Basic metabolic panel  Result Value Ref Range   Sodium 137 135 - 145 mmol/L   Potassium 4.0 3.5 - 5.1 mmol/L   Chloride 100 (L) 101 - 111 mmol/L   CO2 29 22 - 32 mmol/L   Glucose, Bld 103 (H) 65 - 99 mg/dL   BUN 15 6 - 20 mg/dL   Creatinine, Ser 9.811.16 0.61 - 1.24 mg/dL   Calcium 9.5 8.9 - 19.110.3 mg/dL   GFR calc non Af Amer >60 >60 mL/min   GFR calc Af Amer >60 >60 mL/min   Anion gap 8 5 - 15  CBC  Result Value Ref Range   WBC 5.5 3.8 - 10.6 K/uL   RBC 4.59 4.40 - 5.90 MIL/uL   Hemoglobin 13.4 13.0 - 18.0 g/dL   HCT 47.839.8 (L) 29.540.0 - 62.152.0 %   MCV 86.6 80.0 - 100.0 fL   MCH 29.2 26.0 - 34.0 pg   MCHC 33.7 32.0 - 36.0 g/dL   RDW 30.814.8 (H) 65.711.5 - 84.614.5 %   Platelets 210 150 - 440 K/uL  Troponin I  Result Value Ref Range   Troponin I <0.03 <0.03 ng/mL   Dg Chest 2 View  Result Date: 12/02/2016 CLINICAL DATA:  Chest pain. EXAM: CHEST  2 VIEW COMPARISON:  Chest x-ray dated September 27, 2015. FINDINGS: The cardiomediastinal silhouette is normal in size. Normal pulmonary vascularity. No focal consolidation, pleural effusion, or pneumothorax. No acute osseous abnormality. IMPRESSION: No active cardiopulmonary disease. Electronically Signed   By: Obie DredgeWilliam T Derry M.D.   On: 12/02/2016 15:25

## 2016-12-02 NOTE — ED Provider Notes (Signed)
[facility] Emergency Department Provider Note  ____________________________________________  Time seen: Approximately 3:56 PM  I have reviewed the triage vital signs and the nursing notes.   HISTORY  Chief Complaint Chest Pain    HPI Juan Best is a 36 y.o. male who complains of intermittent chest pain for the past month. It's in the center of the chest, happens after arguing with his girlfriend or stressing about his list of chores he has at home.Also seems to be positional, worse after bending over. He notes that he has early satiety whenever he eats as well and has been unable to gain weight. Denies exertional or pleuritic symptoms. Pain is alleviated by distraction and finding other things to do the take his mind off of it. Denies vomiting diaphoresis or radiation. Pain is described as tightness. No shortness of breath.  No recent travel, hospitalization or surgery. No history of DVT or PE   History reviewed. No pertinent past medical history.   There are no active problems to display for this patient.    Past Surgical History:  Procedure Laterality Date  . ANKLE SURGERY       Prior to Admission medications   Medication Sig Start Date End Date Taking? Authorizing Provider  promethazine-dextromethorphan (PROMETHAZINE-DM) 6.25-15 MG/5ML syrup Take 5 mLs by mouth 4 (four) times daily as needed for cough. Patient not taking: Reported on 09/27/2015 07/09/15   Hagler, Jami L, PA-C  ranitidine (ZANTAC) 150 MG capsule Take 1 capsule (150 mg total) by mouth 2 (two) times daily. 12/02/16   Sharman Cheek, MD     Allergies Sulfa antibiotics   No family history on file.  Social History Social History  Substance Use Topics  . Smoking status: Current Every Day Smoker    Packs/day: 0.50    Types: Cigarettes  . Smokeless tobacco: Never Used  . Alcohol use Yes     Comment: two 40 oz beers a day    Review of Systems  Constitutional:   No  fever or chills.  ENT:   No sore throat. No rhinorrhea. Cardiovascular:   Positive as above chest pain without syncope. Respiratory:   No dyspnea or cough. Gastrointestinal:   Negative for abdominal pain, vomiting and diarrhea.  Musculoskeletal:   Negative for focal pain or swelling All other systems reviewed and are negative except as documented above in ROS and HPI.  ____________________________________________   PHYSICAL EXAM:  VITAL SIGNS: ED Triage Vitals [12/02/16 1504]  Enc Vitals Group     BP 123/81     Pulse Rate 64     Resp 15     Temp 98.8 F (37.1 C)     Temp Source Oral     SpO2 100 %     Weight 140 lb (63.5 kg)     Height 5\' 9"  (1.753 m)     Head Circumference      Peak Flow      Pain Score      Pain Loc      Pain Edu?      Excl. in GC?     Vital signs reviewed, nursing assessments reviewed.   Constitutional:   Alert and oriented. Well appearing and in no distress. Eyes:   No scleral icterus.  EOMI. No nystagmus. No conjunctival pallor. PERRL. ENT   Head:   Normocephalic and atraumatic.   Nose:   No congestion/rhinnorhea.    Mouth/Throat:   MMM, no pharyngeal erythema. No peritonsillar mass.  Neck:   No meningismus. Full ROM Hematological/Lymphatic/Immunilogical:   No cervical lymphadenopathy. Cardiovascular:   RRR. Symmetric bilateral radial and DP pulses.  No murmurs.  Respiratory:   Normal respiratory effort without tachypnea/retractions. Breath sounds are clear and equal bilaterally. No wheezes/rales/rhonchi. Gastrointestinal:   Soft and nontender. Non distended. There is no CVA tenderness.  No rebound, rigidity, or guarding. Genitourinary:   deferred Musculoskeletal:   Normal range of motion in all extremities. No joint effusions.  No lower extremity tenderness.  No edema.Chest wall nontender. Neurologic:   Normal speech and language.  Motor grossly intact. No gross focal neurologic deficits are appreciated.  Skin:    Skin is warm,  dry and intact. No rash noted.  No petechiae, purpura, or bullae.  ____________________________________________    LABS (pertinent positives/negatives) (all labs ordered are listed, but only abnormal results are displayed) Labs Reviewed  BASIC METABOLIC PANEL - Abnormal; Notable for the following:       Result Value   Chloride 100 (*)    Glucose, Bld 103 (*)    All other components within normal limits  CBC - Abnormal; Notable for the following:    HCT 39.8 (*)    RDW 14.8 (*)    All other components within normal limits  TROPONIN I   ____________________________________________   EKG  Interpreted by me Sinus rhythm rate of 62, normal axis. Short PR interval of 106 ms, no associated delta wave or evidence of preexcitation. QRS ST segments and T waves. No acute ischemic changes.  ____________________________________________    RADIOLOGY  Dg Chest 2 View  Result Date: 12/02/2016 CLINICAL DATA:  Chest pain. EXAM: CHEST  2 VIEW COMPARISON:  Chest x-ray dated September 27, 2015. FINDINGS: The cardiomediastinal silhouette is normal in size. Normal pulmonary vascularity. No focal consolidation, pleural effusion, or pneumothorax. No acute osseous abnormality. IMPRESSION: No active cardiopulmonary disease. Electronically Signed   By: Obie DredgeWilliam T Derry M.D.   On: 12/02/2016 15:25    ____________________________________________   PROCEDURES Procedures  ____________________________________________   INITIAL IMPRESSION / ASSESSMENT AND PLAN / ED COURSE  Pertinent labs & imaging results that were available during my care of the patient were reviewed by me and considered in my medical decision making (see chart for details).  Patient well appearing no acute distress presents with a month of intermittent chest pain, atypical. Likely GERD versus bronchospasm versus stress-related.Considering the patient's symptoms, medical history, and physical examination today, I have low suspicion for  ACS, PE, TAD, pneumothorax, carditis, mediastinitis, pneumonia, CHF, or sepsis.        ____________________________________________   FINAL CLINICAL IMPRESSION(S) / ED DIAGNOSES  Final diagnoses:  Atypical chest pain  Anxiety      New Prescriptions   RANITIDINE (ZANTAC) 150 MG CAPSULE    Take 1 capsule (150 mg total) by mouth 2 (two) times daily.     Portions of this note were generated with dragon dictation software. Dictation errors may occur despite best attempts at proofreading.    Sharman CheekStafford, Arneda Sappington, MD 12/02/16 [phone]

## 2016-12-02 NOTE — ED Triage Notes (Signed)
Patient presents to ED via POV from home c/o CP x 1 month. Patient also reports lightheadedness and wanting to gain weight. VSS. EKG unremarkable. A&O x4. Denies SOB.

## 2017-11-03 ENCOUNTER — Encounter: Payer: Self-pay | Admitting: Emergency Medicine

## 2017-11-03 ENCOUNTER — Emergency Department
Admission: EM | Admit: 2017-11-03 | Discharge: 2017-11-03 | Disposition: A | Discharge status: Home / Self Care | Payer: Self-pay | Attending: Emergency Medicine | Admitting: Emergency Medicine

## 2017-11-03 ENCOUNTER — Other Ambulatory Visit: Payer: Self-pay

## 2017-11-03 ENCOUNTER — Emergency Department: Payer: Self-pay

## 2017-11-03 DIAGNOSIS — F17210 Nicotine dependence, cigarettes, uncomplicated: Secondary | ICD-10-CM | POA: Insufficient documentation

## 2017-11-03 DIAGNOSIS — M19011 Primary osteoarthritis, right shoulder: Secondary | ICD-10-CM | POA: Insufficient documentation

## 2017-11-03 MED ORDER — PREDNISONE 10 MG PO TABS: 10.0000 mg | ORAL_TABLET | Freq: Every day | ORAL | 0 refills | Status: DC

## 2017-11-03 MED ORDER — PREDNISONE 20 MG PO TABS
60.0000 mg | ORAL_TABLET | Freq: Once | ORAL | Status: AC
  Administered 2017-11-03: 60 mg via ORAL
  Filled 2017-11-03: qty 3

## 2017-11-03 NOTE — ED Triage Notes (Signed)
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 Not filing workers comp.

## 2017-11-03 NOTE — ED Provider Notes (Signed)
[facility] EMERGENCY DEPARTMENT Provider Note   CSN: [id number] Arrival date & time: 11/03/17  1756     History   Chief Complaint Chief Complaint  Patient presents with  . Shoulder Pain    HPI Juan Best is a 37 y.o. male.  Presents to the emergency department for evaluation of right shoulder pain.  Pain is located over the right [facility] joint.  He has had pain and swelling there for 1 year of no history of trauma or injury.  No x-rays in the past.  Patient states last few days his pain is been increasing.  He denies any recent trauma or injury.  He does perform repetitive lifting at work.  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  No neck pain numbness tingling radicular symptoms.  HPI  History reviewed. No pertinent past medical history.  There are no active problems to display for this patient.   Past Surgical History:  Procedure Laterality Date  . ANKLE SURGERY          Home Medications    Prior to Admission medications   Medication Sig Start Date End Date Taking? Authorizing Provider  predniSONE (DELTASONE) 10 MG tablet Take 1 tablet (10 mg total) by mouth daily. 6,5,4,3,2,1 six day taper 11/03/17   Evon Slack, PA-C  promethazine-dextromethorphan (PROMETHAZINE-DM) 6.25-15 MG/5ML syrup Take 5 mLs by mouth 4 (four) times daily as needed for cough. Patient not taking: Reported on 09/27/2015 07/09/15   Hagler, Jami L, PA-C  ranitidine (ZANTAC) 150 MG capsule Take 1 capsule (150 mg total) by mouth 2 (two) times daily. 12/02/16   Sharman Cheek, MD    Family History No family history on file.  Social History Social History   Tobacco Use  . Smoking status: Current Every Day Smoker    Packs/day: 0.50    Types: Cigarettes  . Smokeless tobacco: Never Used  Substance Use Topics  . Alcohol use: Yes    Comment: two 40 oz beers a day  . Drug use: No     Allergies   Sulfa  antibiotics   Review of Systems Review of Systems  Constitutional: Negative for fever.  Musculoskeletal: Positive for arthralgias and joint swelling. Negative for gait problem and neck pain.  Skin: Negative for rash and wound.  Neurological: Negative for dizziness, numbness and headaches.     Physical Exam Updated Vital Signs BP 125/73 (BP Location: Left Arm)   Pulse (!) 50   Temp 97.8 F (36.6 C) (Oral)   Resp 18   Ht 5\' 9"  (1.753 m)   Wt 63.5 kg (140 lb)   SpO2 100%   BMI 20.67 kg/m   Physical Exam  Constitutional: He is oriented to person, place, and time. He appears well-developed and well-nourished.  HENT:  Head: Normocephalic and atraumatic.  Eyes: Conjunctivae are normal.  Neck: Normal range of motion.  Cardiovascular: Normal rate.  Pulmonary/Chest: Effort normal. No respiratory distress.  Musculoskeletal: Normal range of motion.  AC joint tenderness with slight prominence.  No skin breakdown noted.  No warmth or redness.  Full range of motion of the right shoulder.  She is neurovascular intact right upper extremity.  No tenting of the skin.  Neurological: He is alert and oriented to person, place, and time.  Skin: Skin is warm. No rash noted.  Psychiatric: He has a normal mood and affect. His behavior is normal. Thought content normal.  ED Treatments / Results  Labs (all labs ordered are listed, but only abnormal results are displayed) Labs Reviewed - No data to display  EKG None  Radiology Dg Shoulder Right  Result Date: 11/03/2017 CLINICAL DATA:  Chronic right shoulder pain. EXAM: RIGHT SHOULDER - 2+ VIEW COMPARISON:  None. FINDINGS: No acute fracture or dislocation. Mild acromioclavicular joint space narrowing with small marginal osteophytes and tiny subchondral cystic change. The glenohumeral joint space is preserved. Bone mineralization is normal. Soft tissues are unremarkable. The visualized right lung is clear. IMPRESSION: 1. Mild acromioclavicular  osteoarthritis. No acute osseous abnormality. Electronically Signed   By: Obie DredgeWilliam T Derry M.D.   On: 11/03/2017 19:09    Procedures Procedures (including critical care time)  Medications Ordered in ED Medications  predniSONE (DELTASONE) tablet 60 mg (has no administration in time range)     Initial Impression / Assessment and Plan / ED Course  I have reviewed the triage vital signs and the nursing notes.  Pertinent labs & imaging results that were available during my care of the patient were reviewed by me and considered in my medical decision making (see chart for details).     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  He started on a Medrol Dosepak.  Pain likely exacerbated by increase in activity at work.  Recommend rest for the next 2 days and follow-up with orthopedics if no improvement 1 week.  Final Clinical Impressions(s) / ED Diagnoses   Final diagnoses:  Osteoarthritis of right AC (acromioclavicular) joint    ED Discharge Orders        Ordered    predniSONE (DELTASONE) 10 MG tablet  Daily     11/03/17 1945       Ronnette JuniperGaines, Avynn Klassen C, PA-C 11/03/17 1949    Jeanmarie PlantMcShane, James A, MD 11/03/17 [phone]

## 2017-11-03 NOTE — Discharge Instructions (Addendum)
Please take prednisone as prescribed.  Apply ice to the shoulder.  Rest for the next 24 to 48 hours and follow with orthopedics if no improvement 1 week.

## 2017-11-03 NOTE — ED Notes (Signed)
Pt c/o right shoulder pain states he has had it for almost 2 years states worse over the past week, taking tylenol without relief.  Difficulty raising arm up over head.

## 2017-11-03 NOTE — ED Notes (Signed)
Pt ambulatory to POV without difficulty. VSS, NAD. Discharge instructions, RX and follow up reviewed. All questions addressed.  

## 2017-11-17 ENCOUNTER — Encounter: Payer: Self-pay | Admitting: Emergency Medicine

## 2017-11-17 ENCOUNTER — Emergency Department
Admission: EM | Admit: 2017-11-17 | Discharge: 2017-11-17 | Disposition: A | Discharge status: Home / Self Care | Payer: Self-pay | Attending: Student in an Organized Health Care Education/Training Program | Admitting: Student in an Organized Health Care Education/Training Program

## 2017-11-17 ENCOUNTER — Other Ambulatory Visit: Payer: Self-pay

## 2017-11-17 DIAGNOSIS — M5412 Radiculopathy, cervical region: Secondary | ICD-10-CM | POA: Insufficient documentation

## 2017-11-17 DIAGNOSIS — Z79899 Other long term (current) drug therapy: Secondary | ICD-10-CM | POA: Insufficient documentation

## 2017-11-17 DIAGNOSIS — F17210 Nicotine dependence, cigarettes, uncomplicated: Secondary | ICD-10-CM | POA: Insufficient documentation

## 2017-11-17 DIAGNOSIS — M19019 Primary osteoarthritis, unspecified shoulder: Secondary | ICD-10-CM | POA: Insufficient documentation

## 2017-11-17 MED ORDER — CYCLOBENZAPRINE HCL 10 MG PO TABS: 10.0000 mg | ORAL_TABLET | Freq: Three times a day (TID) | ORAL | 0 refills | Status: DC | PRN

## 2017-11-17 MED ORDER — PREDNISONE 10 MG PO TABS: 10.0000 mg | ORAL_TABLET | Freq: Every day | ORAL | 0 refills | Status: DC

## 2017-11-17 NOTE — Discharge Instructions (Signed)
Please call and schedule a follow up with orthopedics. Return to the ER for symptoms that change or worsen or for new concerns.

## 2017-11-17 NOTE — ED Triage Notes (Signed)
C/o right shoulder pain x 3 weeks.  Unsure of injury.  Seen through ED about one week ago for same complaint, but pain has not improved.

## 2017-11-17 NOTE — ED Provider Notes (Signed)
[facility] Emergency Department Provider Note ____________________________________________  Time seen: Approximately 7:35 PM  I have reviewed the triage vital signs and the nursing notes.   HISTORY  Chief Complaint Shoulder Pain    HPI Juan Best is a 37 y.o. male who presents to the emergency department for evaluation and treatment of right shoulder pain. Pain has been present for the past 3 weeks. He was evaluated here and given prednisone. He states the medication helped, but after he finished taking it the pain returned. He did not follow up with orthopedics as previously advised. He denies new injury. He states that his job requires repetitive lifting overhead and this seems to make the pain worse.  He has not taken anything over the past 24 hours for this pain.  History reviewed. No pertinent past medical history.  There are no active problems to display for this patient.   Past Surgical History:  Procedure Laterality Date  . ANKLE SURGERY      Prior to Admission medications   Medication Sig Start Date End Date Taking? Authorizing Provider  cyclobenzaprine (FLEXERIL) 10 MG tablet Take 1 tablet (10 mg total) by mouth 3 (three) times daily as needed for muscle spasms. 11/17/17   Devany Aja B, FNP  predniSONE (DELTASONE) 10 MG tablet Take 1 tablet (10 mg total) by mouth daily. 6,5,4,3,2,1 six day taper 11/17/17   Ellyana Crigler, Rulon Eisenmenger B, FNP  promethazine-dextromethorphan (PROMETHAZINE-DM) 6.25-15 MG/5ML syrup Take 5 mLs by mouth 4 (four) times daily as needed for cough. Patient not taking: Reported on 09/27/2015 07/09/15   Hagler, Jami L, PA-C  ranitidine (ZANTAC) 150 MG capsule Take 1 capsule (150 mg total) by mouth 2 (two) times daily. 12/02/16   Sharman Cheek, MD    Allergies Sulfa antibiotics  No family history on file.  Social History Social History   Tobacco Use  . Smoking status: Current Every Day Smoker    Packs/day: 0.50    Types:  Cigarettes  . Smokeless tobacco: Never Used  Substance Use Topics  . Alcohol use: Yes    Comment: two 40 oz beers a day  . Drug use: No    Review of Systems Constitutional: Negative for fever. Cardiovascular: Negative for chest pain. Respiratory: Negative for shortness of breath. Musculoskeletal: Positive for right shoulder pain. Skin: Negative for open wound or lesion. Neurological: Negative for decrease in sensation  ____________________________________________   PHYSICAL EXAM:  VITAL SIGNS: ED Triage Vitals  Enc Vitals Group     BP 11/17/17 1852 115/75     Pulse Rate 11/17/17 1852 80     Resp 11/17/17 1852 18     Temp 11/17/17 1852 98.4 F (36.9 C)     Temp Source 11/17/17 1852 Oral     SpO2 11/17/17 1852 98 %     Weight 11/17/17 1755 142 lb (64.4 kg)     Height 11/17/17 1755 5\' 9"  (1.753 m)     Head Circumference --      Peak Flow --      Pain Score 11/17/17 1755 9     Pain Loc --      Pain Edu? --      Excl. in GC? --     Constitutional: Alert and oriented. Well appearing and in no acute distress. Eyes: Conjunctivae are clear without discharge or drainage Head: Atraumatic Neck: Supple Respiratory: No cough. Respirations are even and unlabored. Musculoskeletal: Tenderness palpated over the [facility] joint.  Limited abduction of the right arm  secondary to pain.  No step-off deformity is noted.  Full range of motion of the right elbow, wrist, and hand. Neurologic: Radicular pain from the right lateral neck to the right elbow. Skin: Intact without open wound or lesion on exposed skin surfaces. Psychiatric: Affect and behavior are appropriate.  ____________________________________________   LABS (all labs ordered are listed, but only abnormal results are displayed)  Labs Reviewed - No data to display ____________________________________________  RADIOLOGY  Not  indicated. ____________________________________________   PROCEDURES  Procedures  ____________________________________________   INITIAL IMPRESSION / ASSESSMENT AND PLAN / ED COURSE  Juan Best is a 37 y.o. who presents to the emergency department for evaluation of right shoulder pain.  No indication for images today as the patient had images 3 weeks ago without any acute bony abnormality and has had no new injury.  Patient will be given a prescription for prednisone and Flexeril.  Patient instructed to follow-up with orthopedics.  He was also instructed to return to the emergency department for symptoms that change or worsen if unable schedule an appointment with orthopedics or primary care.  Medications - No data to display  Pertinent labs & imaging results that were available during my care of the patient were reviewed by me and considered in my medical decision making (see chart for details).  _________________________________________   FINAL CLINICAL IMPRESSION(S) / ED DIAGNOSES  Final diagnoses:  Cervical radiculopathy  AC joint arthropathy    ED Discharge Orders        Ordered    predniSONE (DELTASONE) 10 MG tablet  Daily,   Status:  Discontinued     11/17/17 1855    cyclobenzaprine (FLEXERIL) 10 MG tablet  3 times daily PRN,   Status:  Discontinued     11/17/17 1855    cyclobenzaprine (FLEXERIL) 10 MG tablet  3 times daily PRN,   Status:  Discontinued     11/17/17 1859    predniSONE (DELTASONE) 10 MG tablet  Daily     11/17/17 1859    cyclobenzaprine (FLEXERIL) 10 MG tablet  3 times daily PRN     11/17/17 1901       If controlled substance prescribed during this visit, 12 month history viewed on the [facility] prior to issuing an initial prescription for Schedule II or III opiod.    Chinita Pesterriplett, Jazmine Longshore B, FNP 11/17/17 1940    Willy Eddyobinson, Patrick, MD 11/17/17 2045

## 2018-03-12 ENCOUNTER — Emergency Department
Admission: EM | Admit: 2018-03-12 | Discharge: 2018-03-12 | Disposition: A | Discharge status: Home / Self Care | Payer: Self-pay | Attending: Emergency Medicine | Admitting: Emergency Medicine

## 2018-03-12 ENCOUNTER — Encounter: Payer: Self-pay | Admitting: *Deleted

## 2018-03-12 ENCOUNTER — Other Ambulatory Visit: Payer: Self-pay

## 2018-03-12 DIAGNOSIS — F17210 Nicotine dependence, cigarettes, uncomplicated: Secondary | ICD-10-CM | POA: Insufficient documentation

## 2018-03-12 DIAGNOSIS — M19019 Primary osteoarthritis, unspecified shoulder: Secondary | ICD-10-CM | POA: Insufficient documentation

## 2018-03-12 DIAGNOSIS — M7581 Other shoulder lesions, right shoulder: Secondary | ICD-10-CM | POA: Insufficient documentation

## 2018-03-12 MED ORDER — TRAMADOL HCL 50 MG PO TABS: 50.0000 mg | ORAL_TABLET | Freq: Four times a day (QID) | ORAL | 0 refills | Status: DC | PRN

## 2018-03-12 NOTE — Discharge Instructions (Signed)
Get your MRI scheduled as soon as possible.  Return to the ER for symptoms that change or worsen if unable to schedule an appointment.

## 2018-03-12 NOTE — ED Notes (Signed)
Pt reports that sometimes he has pain in the R shoulder that radiates down to his fingers causing numbness. Also radiates up into the R side of his neck. On-off for the last year.

## 2018-03-12 NOTE — ED Triage Notes (Signed)
PT reporting right shoulder pain intermittently for the past couple months. Pt was scheduled to have Xray's but was unable to afford them at the time. The pain subsided but with the cold weather pt reports the pain has returned. NO deformity. Pt is a Information systems managerlabor worker and unsure of the exact injury that caused the pain to start.

## 2018-03-12 NOTE — ED Provider Notes (Signed)
[facility] Emergency Department Provider Note ____________________________________________  Time seen: Approximately 12:19 PM  I have reviewed the triage vital signs and the nursing notes.   HISTORY  Chief Complaint Shoulder Pain    HPI Juan Best is a 37 y.o. male who presents to the emergency department for evaluation and treatment of chronic right shoulder pain.  Patient states that he repetitively lifts boxes while at work and since the weather has gotten colder, his shoulder is aching and increasingly painful.  He has been evaluated here as well as by Dr. Margaretann Loveless and his staff.  He was unable to pay the deductible for a recommended shoulder MRI.  Patient states he has not taken anything to help with his pain because he was not sure what to take.  He states that when he has been prescribed prednisone and Flexeril they have not provided him any relief.  He denies any numbness or tingling or radiating pain into the neck or arm.  History reviewed. No pertinent past medical history.  There are no active problems to display for this patient.   Past Surgical History:  Procedure Laterality Date  . ANKLE SURGERY      Prior to Admission medications   Medication Sig Start Date End Date Taking? Authorizing Provider  cyclobenzaprine (FLEXERIL) 10 MG tablet Take 1 tablet (10 mg total) by mouth 3 (three) times daily as needed for muscle spasms. 11/17/17   Juan Best  predniSONE (DELTASONE) 10 MG tablet Take 1 tablet (10 mg total) by mouth daily. 6,5,4,3,2,1 six day taper 11/17/17   Juan Best  promethazine-dextromethorphan (PROMETHAZINE-DM) 6.25-15 MG/5ML syrup Take 5 mLs by mouth 4 (four) times daily as needed for cough. Patient not taking: Reported on 09/27/2015 07/09/15   Hagler, Jami Best, Juan Best  ranitidine (ZANTAC) 150 MG capsule Take 1 capsule (150 mg total) by mouth 2 (two) times daily. 12/02/16   Juan Cheek, Best  traMADol (ULTRAM) 50 MG  tablet Take 1 tablet (50 mg total) by mouth every 6 (six) hours as needed. 03/12/18   Juan Best, Rulon Eisenmenger Best, Best    Allergies Sulfa antibiotics  History reviewed. No pertinent family history.  Social History Social History   Tobacco Use  . Smoking status: Current Every Day Smoker    Packs/day: 0.50    Types: Cigarettes  . Smokeless tobacco: Never Used  Substance Use Topics  . Alcohol use: Yes    Comment: two 40 oz beers a day  . Drug use: No    Review of Systems Constitutional: Negative for fever. Cardiovascular: Negative for chest pain. Respiratory: Negative for shortness of breath. Musculoskeletal: Positive right shoulder pain. Skin: Negative for open wound or lesion Neurological: Negative for decrease in sensation  ____________________________________________   PHYSICAL EXAM:  VITAL SIGNS: ED Triage Vitals  Enc Vitals Group     BP 03/12/18 1123 113/79     Pulse Rate 03/12/18 1123 83     Resp 03/12/18 1123 16     Temp 03/12/18 1123 98.1 F (36.7 C)     Temp Source 03/12/18 1123 Oral     SpO2 03/12/18 1123 99 %     Weight 03/12/18 1124 140 lb (63.5 kg)     Height 03/12/18 1124 5\' 9"  (1.753 m)     Head Circumference --      Peak Flow --      Pain Score 03/12/18 1123 10     Pain Loc --  Pain Edu? --      Excl. in GC? --     Constitutional: Alert and oriented. Well appearing and in no acute distress. Eyes: Conjunctivae are clear without discharge or drainage Head: Atraumatic Neck: Supple Respiratory: No cough. Respirations are even and unlabored. Musculoskeletal: Limited abduction and external rotation of the right shoulder otherwise, full range of motion throughout the other extremities. Neurologic: Motor and sensory function is intact. Skin: No open wound or lesion noted on exposed skin surfaces. Psychiatric: Affect and behavior are appropriate.  ____________________________________________   LABS (all labs ordered are listed, but only abnormal  results are displayed)  Labs Reviewed - No data to display ____________________________________________  RADIOLOGY  Not indicated ____________________________________________   PROCEDURES  Procedures  ____________________________________________   INITIAL IMPRESSION / ASSESSMENT AND PLAN / ED COURSE  Juan Best is a 37 y.o. who presents to the emergency department for treatment and evaluation of right shoulder pain that is chronic.  He will be treated with tramadol and advised to take ibuprofen every 6-8 hours.  Juan Best controlled substance registry was reviewed prior to prescribing tramadol.  No record of recent narcotic or controlled substance prescriptions.  Patient instructed to follow-up with orthopedics when possible.  He was also instructed to return to the emergency department for symptoms that change or worsen if unable schedule an appointment with orthopedics or primary care.  Medications - No data to display  Pertinent labs & imaging results that were available during my care of the patient were reviewed by me and considered in my medical decision making (see chart for details).  _________________________________________   FINAL CLINICAL IMPRESSION(S) / ED DIAGNOSES  Final diagnoses:  Tendinitis of right rotator cuff  AC joint arthropathy    ED Discharge Orders         Ordered    traMADol (ULTRAM) 50 MG tablet  Every 6 hours PRN     03/12/18 1223           If controlled substance prescribed during this visit, 12 month history viewed on the [facility] prior to issuing an initial prescription for Schedule II or III opiod.    Juan Best 03/12/18 1442    Juan Best 03/12/18 [phone]

## 2018-05-28 ENCOUNTER — Other Ambulatory Visit: Payer: Self-pay

## 2018-05-28 ENCOUNTER — Encounter: Payer: Self-pay | Admitting: Intensive Care

## 2018-05-28 ENCOUNTER — Emergency Department
Admission: EM | Admit: 2018-05-28 | Discharge: 2018-05-28 | Disposition: A | Discharge status: Home / Self Care | Payer: Self-pay | Attending: Emergency Medicine | Admitting: Emergency Medicine

## 2018-05-28 DIAGNOSIS — F17210 Nicotine dependence, cigarettes, uncomplicated: Secondary | ICD-10-CM | POA: Insufficient documentation

## 2018-05-28 DIAGNOSIS — M19011 Primary osteoarthritis, right shoulder: Secondary | ICD-10-CM | POA: Insufficient documentation

## 2018-05-28 MED ORDER — NAPROXEN 500 MG PO TABS
500.0000 mg | ORAL_TABLET | Freq: Once | ORAL | Status: AC
  Administered 2018-05-28: 500 mg via ORAL
  Filled 2018-05-28: qty 1

## 2018-05-28 MED ORDER — NAPROXEN 500 MG PO TABS: 500.0000 mg | ORAL_TABLET | Freq: Two times a day (BID) | ORAL | 2 refills | Status: DC

## 2018-05-28 MED ORDER — LIDOCAINE 5 % EX PTCH
1.0000 | MEDICATED_PATCH | CUTANEOUS | Status: DC
  Administered 2018-05-28: 1 via TRANSDERMAL
  Filled 2018-05-28: qty 1

## 2018-05-28 NOTE — ED Notes (Signed)
Pt c/o right shoulder pain x2 weeks. Pt denies any specific injury and reports pain when lifting shoulder. Pt also describes difficulty at work lifting object and moving around.

## 2018-05-28 NOTE — ED Triage Notes (Signed)
Patient c/o right shoulder pain X1 week. Denies injury. Patient moving extremity in triage with no problem noted

## 2018-05-28 NOTE — ED Provider Notes (Signed)
[facility] Emergency Department Provider Note   ____________________________________________   First MD Initiated Contact with Patient 05/28/18 1137     (approximate)  I have reviewed the triage vital signs and the nursing notes.   HISTORY  Chief Complaint Shoulder Pain (right)    HPI Juan Best is a 38 y.o. male patient presents with right shoulder pain x1 week.  Patient denies provocative incident.  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  Patient seen orthopedic and diagnosed with mild  arthritis  of the [facility] joint.  Patient rates his pain as a 9/10.  Patient described the pain is "achy".  No palliative measures for complaint.   History reviewed. No pertinent past medical history.  There are no active problems to display for this patient.   Past Surgical History:  Procedure Laterality Date  . ANKLE SURGERY      Prior to Admission medications   Medication Sig Start Date End Date Taking? Authorizing Provider  cyclobenzaprine (FLEXERIL) 10 MG tablet Take 1 tablet (10 mg total) by mouth 3 (three) times daily as needed for muscle spasms. 11/17/17   Triplett, Rulon Eisenmenger B, FNP  naproxen (NAPROSYN) 500 MG tablet Take 1 tablet (500 mg total) by mouth 2 (two) times daily with a meal. 05/28/18   Joni Reining, PA-C  predniSONE (DELTASONE) 10 MG tablet Take 1 tablet (10 mg total) by mouth daily. 6,5,4,3,2,1 six day taper 11/17/17   Triplett, Rulon Eisenmenger B, FNP  promethazine-dextromethorphan (PROMETHAZINE-DM) 6.25-15 MG/5ML syrup Take 5 mLs by mouth 4 (four) times daily as needed for cough. Patient not taking: Reported on 09/27/2015 07/09/15   Hagler, Jami L, PA-C  ranitidine (ZANTAC) 150 MG capsule Take 1 capsule (150 mg total) by mouth 2 (two) times daily. 12/02/16   Sharman Cheek, MD  traMADol (ULTRAM) 50 MG tablet Take 1 tablet (50 mg total) by mouth every 6 (six) hours as needed. 03/12/18   Triplett, Rulon Eisenmenger B,  FNP    Allergies Sulfa antibiotics  History reviewed. No pertinent family history.  Social History Social History   Tobacco Use  . Smoking status: Current Every Day Smoker    Packs/day: 0.50    Types: Cigarettes  . Smokeless tobacco: Never Used  Substance Use Topics  . Alcohol use: Yes    Comment: two 40 oz beers a day  . Drug use: No    Review of Systems Constitutional: No fever/chills Eyes: No visual changes. ENT: No sore throat. Cardiovascular: Denies chest pain. Respiratory: Denies shortness of breath. Gastrointestinal: No abdominal pain.  No nausea, no vomiting.  No diarrhea.  No constipation. Genitourinary: Negative for dysuria. Musculoskeletal: Right shoulder pain. Skin: Negative for rash. Neurological: Negative for headaches, focal weakness or numbness. Allergic/Immunilogical: Sulfur antibiotics. ____________________________________________   PHYSICAL EXAM:  VITAL SIGNS: ED Triage Vitals  Enc Vitals Group     BP 05/28/18 1134 117/78     Pulse Rate 05/28/18 1134 91     Resp 05/28/18 1134 14     Temp 05/28/18 1134 98.7 F (37.1 C)     Temp Source 05/28/18 1134 Oral     SpO2 05/28/18 1134 96 %     Weight 05/28/18 1135 13 lb (5.897 kg)     Height 05/28/18 1135 5\' 9"  (1.753 m)     Head Circumference --      Peak Flow --      Pain Score 05/28/18 1134 9  Pain Loc --      Pain Edu? --      Excl. in GC? --    Constitutional: Alert and oriented. Well appearing and in no acute distress. Neck: No stridor.   Cardiovascular: Normal rate, regular rhythm. Grossly normal heart sounds.  Good peripheral circulation. Respiratory: Normal respiratory effort.  No retractions. Lungs CTAB. Gastrointestinal: Soft and nontender. No distention. No abdominal bruits. No CVA tenderness. Musculoskeletal: No obvious deformity to the right shoulder.  Patient demonstrates full equal range of motion.  Patient has decreased strength against resistance in comparison to the left  upper extremity.  Patient has moderate guarding at the [facility] joint. Neurologic:  Normal speech and language. No gross focal neurologic deficits are appreciated. No gait instability. Skin:  Skin is warm, dry and intact. No rash noted. Psychiatric: Mood and affect are normal. Speech and behavior are normal.  ____________________________________________   LABS (all labs ordered are listed, but only abnormal results are displayed)  Labs Reviewed - No data to display ____________________________________________  EKG   ____________________________________________  RADIOLOGY  ED MD interpretation:    Official radiology report(s): No results found.  ____________________________________________   PROCEDURES  Procedure(s) performed: None  Procedures  Critical Care performed: No  ____________________________________________   INITIAL IMPRESSION / ASSESSMENT AND PLAN / ED COURSE  As part of my medical decision making, I reviewed the following data within the electronic MEDICAL RECORD NUMBER     Right shoulder pain secondary arthritis.  Patient given discharge care instructions and advised to take medication as directed.  Patient [facility]      ____________________________________________   FINAL CLINICAL IMPRESSION(S) / ED DIAGNOSES  Final diagnoses:  Arthritis of right shoulder region     ED Discharge Orders         Ordered    naproxen (NAPROSYN) 500 MG tablet  2 times daily with meals     05/28/18 1157           Note:  This document was prepared using Dragon voice recognition software and may include unintentional dictation errors.    Joni Reining, PA-C 05/28/18 1201    Arnaldo Natal, MD 05/28/18 [phone]

## 2018-06-06 ENCOUNTER — Ambulatory Visit
Admission: EM | Admit: 2018-06-06 | Discharge: 2018-06-06 | Disposition: A | Discharge status: Home / Self Care | Payer: Self-pay | Attending: Family Medicine | Admitting: Family Medicine

## 2018-06-06 ENCOUNTER — Other Ambulatory Visit: Payer: Self-pay

## 2018-06-06 DIAGNOSIS — M778 Other enthesopathies, not elsewhere classified: Secondary | ICD-10-CM

## 2018-06-06 DIAGNOSIS — M19011 Primary osteoarthritis, right shoulder: Secondary | ICD-10-CM

## 2018-06-06 DIAGNOSIS — M779 Enthesopathy, unspecified: Secondary | ICD-10-CM

## 2018-06-06 DIAGNOSIS — M7581 Other shoulder lesions, right shoulder: Secondary | ICD-10-CM

## 2018-06-06 HISTORY — DX: Gastro-esophageal reflux disease without esophagitis: K21.9

## 2018-06-06 HISTORY — DX: Unspecified osteoarthritis, unspecified site: M19.90

## 2018-06-06 MED ORDER — MELOXICAM 15 MG PO TABS: 15.0000 mg | ORAL_TABLET | Freq: Every day | ORAL | 0 refills | Status: DC | PRN

## 2018-06-06 NOTE — Discharge Instructions (Signed)
Take medication as prescribed. Rest. Drink plenty of fluids.  Avoid aggravating activities as able.  Stretch.  You need to follow-up with orthopedic as discussed.  See above to call to schedule.  Follow up with your primary care physician this week as needed. Return to Urgent care for new or worsening concerns.

## 2018-06-06 NOTE — ED Provider Notes (Signed)
[facility] ____________________________________________  Time seen: Approximately 3:16 PM  I have reviewed the triage vital signs and the nursing notes.   HISTORY  Chief Complaint Shoulder Pain   HPI Juan Best is a 38 y.o. male presenting for evaluation of right shoulder pain that has been going on for approximately 7 years.  Patient states that since 2013 this pain has been coming and going and tends to flareup with the cold weather.  Patient reports that he does a lot of overhead lifting and repetitive actions with his arms on a regular basis.  Denies any fall, direct injury or direct trauma.  Reports this has been previously evaluated.  With previous x-rays.  No injury recently.  Has not been taking any over-the-counter medication for the same complaints.  Was seen in emergency room in January for the same complaint and was given naproxen, and states that he did take this without much resolution.  Has not yet seen orthopedic.  Right-hand-dominant.  Denies paresthesias, loss of range of motion, neck or back pain, chest pain, shortness of breath or other complaints.    Patient, No Pcp Per  Past Medical History:  Diagnosis Date  . Arthritis   . GERD (gastroesophageal reflux disease)     There are no active problems to display for this patient.   Past Surgical History:  Procedure Laterality Date  . ANKLE SURGERY       No current facility-administered medications for this encounter.   Current Outpatient Medications:  .  meloxicam (MOBIC) 15 MG tablet, Take 1 tablet (15 mg total) by mouth daily as needed., Disp: 14 tablet, Rfl: 0  Allergies Sulfa antibiotics  History reviewed. No pertinent family history.  Social History Social History   Tobacco Use  . Smoking status: Current Every Day Smoker    Packs/day: 0.50    Types: Cigarettes  . Smokeless tobacco: Never Used  Substance Use Topics  . Alcohol use: Yes    Comment: two 40 oz beers a day  .  Drug use: No    Review of Systems Constitutional: No fever Cardiovascular: Denies chest pain. Respiratory: Denies shortness of breath. Gastrointestinal: No abdominal pain.  Musculoskeletal: Negative for back pain.  Right shoulder pain Skin: Negative for rash.   ____________________________________________   PHYSICAL EXAM:  VITAL SIGNS: ED Triage Vitals  Enc Vitals Group     BP 06/06/18 1208 124/79     Pulse Rate 06/06/18 1208 85     Resp 06/06/18 1208 16     Temp 06/06/18 1208 97.8 F (36.6 C)     Temp Source 06/06/18 1208 Oral     SpO2 06/06/18 1208 100 %     Weight 06/06/18 1209 140 lb (63.5 kg)     Height 06/06/18 1209 5\' 9"  (1.753 m)     Head Circumference --      Peak Flow --      Pain Score 06/06/18 1208 9     Pain Loc --      Pain Edu? --      Excl. in GC? --     Constitutional: Alert and oriented. Well appearing and in no acute distress. ENT      Head: Normocephalic and atraumatic. Cardiovascular: Normal rate, regular rhythm. Grossly normal heart sounds.  Good peripheral circulation. Respiratory: Normal respiratory effort without tachypnea nor retractions. Breath sounds are clear and equal bilaterally. No wheezes, rales, rhonchi. Musculoskeletal:   No midline cervical, thoracic or lumbar tenderness to palpation. Bilateral distal  radial pulses equal and easily palpated.   Except: Right anterior shoulder at [facility] joint mild localized tenderness to palpation, pain with abduction, minimal increased pain with empty can test, negative drop arm, full range of motion present, no ecchymosis or deformity noted.  Right upper extremity otherwise nontender Neurologic:  Normal speech and language.  Speech is normal. No gait instability.  Skin:  Skin is warm, dry and intact. No rash noted. Psychiatric: Mood and affect are normal. Speech and behavior are normal. Patient exhibits appropriate insight and judgment   ___________________________________________   LABS (all labs  ordered are listed, but only abnormal results are displayed)  Labs Reviewed - No data to display ____________________________________________  PROCEDURES Procedures    INITIAL IMPRESSION / ASSESSMENT AND PLAN / ED COURSE  Pertinent labs & imaging results that were available during my care of the patient were reviewed by me and considered in my medical decision making (see chart for details).  Well-appearing patient.  No acute distress.  Presenting for evaluation of acute on chronic right shoulder pain.  Visualized x-ray from November 03, 2017 of right shoulder showing mild AC osteoarthritis without acute osseous abnormality.  Patient declines any trauma.  Suspect AC osteoarthritis, with tendinitis and overuse injury.  Will start patient on daily Mobic.  Strongly recommend for patient to follow-up with orthopedic.  Work note given for tomorrow.Discussed indication, risks and benefits of medications with patient.  Discussed follow up with Primary care physician this week. Discussed follow up and return parameters including no resolution or any worsening concerns. Patient verbalized understanding and agreed to plan.   ____________________________________________   FINAL CLINICAL IMPRESSION(S) / ED DIAGNOSES  Final diagnoses:  Osteoarthritis of right acromioclavicular joint  Right shoulder tendinitis     ED Discharge Orders         Ordered    meloxicam (MOBIC) 15 MG tablet  Daily PRN     06/06/18 1457           Note: This dictation was prepared with Dragon dictation along with smaller phrase technology. Any transcriptional errors that result from this process are unintentional.         Renford Dills, NP 06/06/18 1525

## 2018-06-06 NOTE — ED Triage Notes (Addendum)
Pt states right shoulder pain "for years" Pain 9/10. 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 Pt seen on 1/31 for same at ER and diagnosed with arthritis

## 2018-07-06 ENCOUNTER — Encounter: Payer: Self-pay | Admitting: Emergency Medicine

## 2018-07-06 ENCOUNTER — Emergency Department
Admission: EM | Admit: 2018-07-06 | Discharge: 2018-07-06 | Disposition: A | Discharge status: Home / Self Care | Payer: Self-pay | Attending: Emergency Medicine | Admitting: Emergency Medicine

## 2018-07-06 ENCOUNTER — Other Ambulatory Visit: Payer: Self-pay

## 2018-07-06 DIAGNOSIS — B354 Tinea corporis: Secondary | ICD-10-CM | POA: Insufficient documentation

## 2018-07-06 MED ORDER — HYDROXYZINE HCL 50 MG PO TABS: 50.0000 mg | ORAL_TABLET | Freq: Three times a day (TID) | ORAL | 0 refills | Status: DC | PRN

## 2018-07-06 MED ORDER — NYSTATIN 100000 UNIT/GM EX CREA: 1.0000 "application " | TOPICAL_CREAM | Freq: Two times a day (BID) | CUTANEOUS | 1 refills | Status: DC

## 2018-07-06 NOTE — ED Provider Notes (Signed)
[facility] Emergency Department Provider Note   ____________________________________________   First MD Initiated Contact with Patient 07/06/18 1009     (approximate)  I have reviewed the triage vital signs and the nursing notes.   HISTORY  Chief Complaint Rash    HPI Juan Best is a 38 y.o. male patient presented for rash began a week ago.  Patient unsure if it is caused by laundry products or personal hygiene products.  Patient the rash is itching.  Patient states the rash is on the right lateral chest wall and groin area.  Patient the rash is associated with intense itching.  Patient denies pain.  No palliative measure for complaint.     Past Medical History:  Diagnosis Date  . Arthritis   . GERD (gastroesophageal reflux disease)     There are no active problems to display for this patient.   Past Surgical History:  Procedure Laterality Date  . ANKLE SURGERY      Prior to Admission medications   Medication Sig Start Date End Date Taking? Authorizing Provider  hydrOXYzine (ATARAX/VISTARIL) 50 MG tablet Take 1 tablet (50 mg total) by mouth 3 (three) times daily as needed. 07/06/18   Joni Reining, PA-C  meloxicam (MOBIC) 15 MG tablet Take 1 tablet (15 mg total) by mouth daily as needed. 06/06/18   Renford Dills, NP  nystatin cream (MYCOSTATIN) Apply 1 application topically 2 (two) times daily. 07/06/18   Joni Reining, PA-C    Allergies Sulfa antibiotics  History reviewed. No pertinent family history.  Social History Social History   Tobacco Use  . Smoking status: Current Every Day Smoker    Packs/day: 0.50    Types: Cigarettes  . Smokeless tobacco: Never Used  Substance Use Topics  . Alcohol use: Yes    Comment: two 40 oz beers a day  . Drug use: No    Review of Systems Constitutional: No fever/chills Eyes: No visual changes. ENT: No sore throat. Cardiovascular: Denies chest pain. Respiratory: Denies shortness of  breath. Gastrointestinal: No abdominal pain.  No nausea, no vomiting.  No diarrhea.  No constipation. Genitourinary: Negative for dysuria. Musculoskeletal: Negative for back pain. Skin: Positive for rash. Neurological: Negative for headaches, focal weakness or numbness. Allergic/Immunilogical: Sulfur antibiotics.  ____________________________________________   PHYSICAL EXAM:  VITAL SIGNS: ED Triage Vitals  Enc Vitals Group     BP 07/06/18 0926 125/84     Pulse Rate 07/06/18 0926 83     Resp 07/06/18 0926 16     Temp 07/06/18 0926 98.8 F (37.1 C)     Temp Source 07/06/18 0926 Oral     SpO2 07/06/18 0926 100 %     Weight 07/06/18 0927 130 lb (59 kg)     Height 07/06/18 0927 5\' 9"  (1.753 m)     Head Circumference --      Peak Flow --      Pain Score 07/06/18 0927 0     Pain Loc --      Pain Edu? --      Excl. in GC? --     Constitutional: Alert and oriented. Well appearing and in no acute distress. Cardiovascular: Normal rate, regular rhythm. Grossly normal heart sounds.  Good peripheral circulation. Respiratory: Normal respiratory effort.  No retractions. Lungs CTAB. Neurologic:  Normal speech and language. No gross focal neurologic deficits are appreciated. No gait instability. Skin:  Skin is warm, dry and intact.  Erythematous macular lesion left lateral  chest wall and righ painful area of the medial right thigh.  Signs of excoriation. Psychiatric: Mood and affect are normal. Speech and behavior are normal.  ____________________________________________   LABS (all labs ordered are listed, but only abnormal results are displayed)  Labs Reviewed - No data to display ____________________________________________  EKG   ____________________________________________  RADIOLOGY  ED MD interpretation:    Official radiology report(s): No results found.  ____________________________________________   PROCEDURES  Procedure(s) performed (including Critical  Care):  Procedures   ____________________________________________   INITIAL IMPRESSION / ASSESSMENT AND PLAN / ED COURSE  As part of my medical decision making, I reviewed the following data within the electronic MEDICAL RECORD NUMBER         Patient's rash was consistent with tenia.  Patient given discharge care instruction advised take medication as directed.  [facility].      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    Joni Reining, PA-C 07/06/18 1107    Sharman Cheek, MD 07/08/18 [id number]

## 2018-07-06 NOTE — ED Triage Notes (Signed)
Pt started with rash 1 week ago.  They switch laundry detergent every week.  He also has new soap.  Has not tried anything OTC for the rash. It itches. No fever. VSS. Unlabored. Appears as abrasion to left forearm. Pt also reports it to left side of torso.

## 2018-07-06 NOTE — ED Notes (Signed)
See triage note  Presents with a rash area noted to left lateral rib area  And then noticed an area on left arm

## 2018-07-09 ENCOUNTER — Ambulatory Visit
Admission: EM | Admit: 2018-07-09 | Discharge: 2018-07-09 | Disposition: A | Discharge status: Home / Self Care | Payer: Self-pay | Attending: Family Medicine | Admitting: Family Medicine

## 2018-07-09 ENCOUNTER — Other Ambulatory Visit: Payer: Self-pay

## 2018-07-09 ENCOUNTER — Encounter: Payer: Self-pay | Admitting: Emergency Medicine

## 2018-07-09 DIAGNOSIS — R1033 Periumbilical pain: Secondary | ICD-10-CM

## 2018-07-09 NOTE — ED Triage Notes (Signed)
Patient c/o bodyaches, stomach pain, chills that started on Monday.  Patient has not checked his temperature.  Patient denies N/V/D.

## 2018-07-09 NOTE — ED Provider Notes (Signed)
[facility]    CSN: [id number] Arrival date & time: 07/09/18  1703     History   Chief Complaint Chief Complaint  Patient presents with  . Abdominal Pain  . Generalized Body Aches    HPI Juan Best is a 38 y.o. male.   HPI  - male presents with vague stomach pain which he indicates is periumbilical.  He is also had body aches and chills that started on Monday but worsened on Wednesday.  He was not able to check his temperature but has felt feverish.  He has had no nausea vomiting or diarrhea.  Belly pain has waxed and waned.  He states that it will occur last for about an hour he takes an Tylenol goes to sleep and awakes and is gone. Last Bowel movement was this morning which was normal.  Vital signs to be afebrile.  Pressure 117/92 otherwise normal.          Past Medical History:  Diagnosis Date  . Arthritis   . GERD (gastroesophageal reflux disease)     There are no active problems to display for this patient.   Past Surgical History:  Procedure Laterality Date  . ANKLE SURGERY         Home Medications    Prior to Admission medications   Medication Sig Start Date End Date Taking? Authorizing Provider  hydrOXYzine (ATARAX/VISTARIL) 50 MG tablet Take 1 tablet (50 mg total) by mouth 3 (three) times daily as needed. 07/06/18  Yes Joni Reining, PA-C    Family History Family History  Problem Relation Age of Onset  . Healthy Mother   . Healthy Father     Social History Social History   Tobacco Use  . Smoking status: Current Every Day Smoker    Packs/day: 0.50    Types: Cigarettes  . Smokeless tobacco: Never Used  Substance Use Topics  . Alcohol use: Yes    Comment: two 40 oz beers a day  . Drug use: No     Allergies   Sulfa antibiotics   Review of Systems Review of Systems  Constitutional: Positive for activity change. Negative for appetite change, chills, fatigue and fever.  Gastrointestinal: Positive for abdominal  pain. Negative for abdominal distention, anal bleeding, blood in stool, constipation, diarrhea, nausea and vomiting.  All other systems reviewed and are negative.    Physical Exam Triage Vital Signs ED Triage Vitals  Enc Vitals Group     BP 07/09/18 1720 (!) 117/92     Pulse Rate 07/09/18 1720 63     Resp 07/09/18 1720 16     Temp 07/09/18 1720 97.7 F (36.5 C)     Temp Source 07/09/18 1720 Oral     SpO2 07/09/18 1720 100 %     Weight 07/09/18 1717 130 lb (59 kg)     Height 07/09/18 1717 5\' 9"  (1.753 m)     Head Circumference --      Peak Flow --      Pain Score 07/09/18 1717 8     Pain Loc --      Pain Edu? --      Excl. in GC? --    No data found.  Updated Vital Signs BP (!) 117/92 (BP Location: Left Arm)   Pulse 63   Temp 97.7 F (36.5 C) (Oral)   Resp 16   Ht 5\' 9"  (1.753 m)   Wt 130 lb (59 kg)   SpO2 100%  BMI 19.20 kg/m   Visual Acuity Right Eye Distance:   Left Eye Distance:   Bilateral Distance:    Right Eye Near:   Left Eye Near:    Bilateral Near:     Physical Exam Vitals signs and nursing note reviewed.  Constitutional:      General: He is not in acute distress.    Appearance: He is well-developed and normal weight. He is not ill-appearing, toxic-appearing or diaphoretic.  HENT:     Head: Normocephalic and atraumatic.     Mouth/Throat:     Mouth: Mucous membranes are moist.     Pharynx: Oropharynx is clear.  Pulmonary:     Effort: Pulmonary effort is normal.     Breath sounds: Normal breath sounds.  Abdominal:     General: Bowel sounds are increased. There is no distension or abdominal bruit. There are no signs of injury.     Palpations: Abdomen is soft.     Tenderness: There is abdominal tenderness in the periumbilical area.  Skin:    General: Skin is warm and dry.  Neurological:     General: No focal deficit present.     Mental Status: He is alert and oriented to person, place, and time.  Psychiatric:        Mood and Affect: Mood  normal.        Behavior: Behavior normal.      UC Treatments / Results  Labs (all labs ordered are listed, but only abnormal results are displayed) Labs Reviewed - No data to display  EKG None  Radiology No results found.  Procedures Procedures (including critical care time)  Medications Ordered in UC Medications - No data to display  Initial Impression / Assessment and Plan / UC Course  I have reviewed the triage vital signs and the nursing notes.  Pertinent labs & imaging results that were available during my care of the patient were reviewed by me and considered in my medical decision making (see chart for details).   And his exam today is not bothersome.  The periumbilical pain comes and goes without significant findings on abdominal exam.  Told him that he should watch and see how the tear of the pain changes if any.  I recommended he follow-up in the emergency room if there is any problem.  The amount of work [employer] and he may return to work at his next scheduled day.  Drink plenty of water.   Final Clinical Impressions(s) / UC Diagnoses   Final diagnoses:  Periumbilical abdominal pain   Discharge Instructions   None    ED Prescriptions    None     Controlled Substance Prescriptions [place] Controlled Substance Registry consulted? Not Applicable   Lutricia Feil, PA-C 07/09/18 2138

## 2018-12-18 ENCOUNTER — Encounter: Payer: Self-pay | Admitting: Internal Medicine

## 2018-12-18 ENCOUNTER — Other Ambulatory Visit: Payer: Self-pay

## 2018-12-18 ENCOUNTER — Emergency Department
Admission: EM | Admit: 2018-12-18 | Discharge: 2018-12-18 | Disposition: A | Discharge status: Home / Self Care | Payer: HRSA Program | Attending: Emergency Medicine | Admitting: Emergency Medicine

## 2018-12-18 DIAGNOSIS — Z20822 Contact with and (suspected) exposure to covid-19: Secondary | ICD-10-CM

## 2018-12-18 DIAGNOSIS — R52 Pain, unspecified: Secondary | ICD-10-CM

## 2018-12-18 DIAGNOSIS — U071 COVID-19: Secondary | ICD-10-CM | POA: Diagnosis not present

## 2018-12-18 DIAGNOSIS — M791 Myalgia, unspecified site: Secondary | ICD-10-CM | POA: Diagnosis not present

## 2018-12-18 DIAGNOSIS — R0981 Nasal congestion: Secondary | ICD-10-CM | POA: Diagnosis not present

## 2018-12-18 DIAGNOSIS — R0989 Other specified symptoms and signs involving the circulatory and respiratory systems: Secondary | ICD-10-CM | POA: Insufficient documentation

## 2018-12-18 DIAGNOSIS — R509 Fever, unspecified: Secondary | ICD-10-CM | POA: Insufficient documentation

## 2018-12-18 DIAGNOSIS — R51 Headache: Secondary | ICD-10-CM | POA: Diagnosis present

## 2018-12-18 DIAGNOSIS — R070 Pain in throat: Secondary | ICD-10-CM | POA: Diagnosis not present

## 2018-12-18 DIAGNOSIS — J3489 Other specified disorders of nose and nasal sinuses: Secondary | ICD-10-CM

## 2018-12-18 DIAGNOSIS — Z20828 Contact with and (suspected) exposure to other viral communicable diseases: Secondary | ICD-10-CM

## 2018-12-18 DIAGNOSIS — F17210 Nicotine dependence, cigarettes, uncomplicated: Secondary | ICD-10-CM | POA: Diagnosis not present

## 2018-12-18 DIAGNOSIS — J029 Acute pharyngitis, unspecified: Secondary | ICD-10-CM

## 2018-12-18 DIAGNOSIS — R519 Headache, unspecified: Secondary | ICD-10-CM

## 2018-12-18 MED ORDER — ACETAMINOPHEN 325 MG PO TABS
650.0000 mg | ORAL_TABLET | Freq: Once | ORAL | Status: AC
  Administered 2018-12-18: 650 mg via ORAL
  Filled 2018-12-18: qty 2

## 2018-12-18 NOTE — ED Provider Notes (Signed)
[facility] Emergency Department Provider Note ____________________________________________  Time seen: 2025  I have reviewed the triage vital signs and the nursing notes.  HISTORY  Chief Complaint  URI   HPI Juan Best is a 38 y.o. male presents to the ER today with complaint of headache, runny nose, nasal congestion and sore throat.  He reports this started last night.  The headache is located all over his head.  He describes the pain is throbbing.  He denies dizziness or visual changes.  He is blowing clear mucus out of his nose.  He denies difficulty swallowing.  He denies ear pain, cough, shortness of breath, loss of taste or smell, nausea, vomiting or diarrhea.  He denies fever or chills but has has body aches.  He has taken Ibuprofen with minimal relief.  He does report positive COVID exposure.  Past Medical History:  Diagnosis Date  . Arthritis   . GERD (gastroesophageal reflux disease)     There are no active problems to display for this patient.   Past Surgical History:  Procedure Laterality Date  . ANKLE SURGERY      Prior to Admission medications   Medication Sig Start Date End Date Taking? Authorizing Provider  hydrOXYzine (ATARAX/VISTARIL) 50 MG tablet Take 1 tablet (50 mg total) by mouth 3 (three) times daily as needed. 07/06/18   Joni ReiningSmith, Ronald K, PA-C    Allergies Sulfa antibiotics  Family History  Problem Relation Age of Onset  . Healthy Mother   . Healthy Father     Social History Social History   Tobacco Use  . Smoking status: Current Every Day Smoker    Packs/day: 0.50    Types: Cigarettes  . Smokeless tobacco: Never Used  Substance Use Topics  . Alcohol use: Yes    Comment: two 40 oz beers a day  . Drug use: No    Review of Systems  Constitutional: Positive for fever and body aches.  Negative for chills. Eyes: Negative for visual changes. ENT: Positive for runny nose, nasal congestion and sore throat.   Negative for ear pain. Cardiovascular: Negative for chest pain or chest tightness. Respiratory: Negative for cough or shortness of breath. Gastrointestinal: Negative for nausea vomiting and diarrhea. Neurological: Positive for headaches negative for focal weakness or numbness. ____________________________________________  PHYSICAL EXAM:  VITAL SIGNS: ED Triage Vitals  Enc Vitals Group     BP 12/18/18 1828 111/77     Pulse Rate 12/18/18 1828 91     Resp 12/18/18 1828 16     Temp 12/18/18 1828 (!) 100.7 F (38.2 C)     Temp src --      SpO2 12/18/18 1828 99 %     Weight 12/18/18 1828 135 lb (61.2 kg)     Height 12/18/18 1828 5\' 9"  (1.753 m)     Head Circumference --      Peak Flow --      Pain Score 12/18/18 1837 9     Pain Loc --      Pain Edu? --      Excl. in GC? --     Constitutional: Alert and oriented. Well appearing and in no distress. Head: Normocephalic and atraumatic. Eyes: Conjunctivae are normal. PERRL. Normal extraocular movements Ears: Canals clear. TMs intact bilaterally. Nose: No congestion/rhinorrhea/epistaxis. Mouth/Throat: Mucous membranes are moist.  Tonsils with mild erythema without exudate noted. Hematological/Lymphatic/Immunological: No cervical lymphadenopathy. Cardiovascular: Normal rate, regular rhythm.  Respiratory: Normal respiratory effort. No wheezes/rales/rhonchi. Gastrointestinal: Soft  and nontender. No distention. Neurologic:  Normal gait without ataxia. Normal speech and language. No gross focal neurologic deficits are appreciated. Skin:  Skin is warm, dry and intact. No rash noted. ____________________________________________   LABS  Lab Orders     SARS CORONAVIRUS 2 Nasal Swab Aptima Multi Swab   ____________________________________________  INITIAL IMPRESSION / ASSESSMENT AND PLAN / ED COURSE  Acute Headache, Stuffy and Runny Nose, Sore Throat, Fever and Body Aches:  We will perform send out testing for COVID-19, will call you  with the results Tylenol 650 mg given in ER Recommend symptomatic care at this time with rest, fluids Tylenol or ibuprofen as needed for fever and body aches Work note provided Discussed the importance of quarantine until results have been reviewed Discussed the importance of handwashing, masking and social distancing Return precautions discussed ____________________________________________  FINAL CLINICAL IMPRESSION(S) / ED DIAGNOSES  Final diagnoses:  Acute intractable headache, unspecified headache type  Stuffy and runny nose  Sore throat  Fever, unspecified fever cause  Body aches  Exposure to Covid-19 Virus   Webb Silversmith, NP    Jearld Fenton, NP 12/18/18 2034    Arta Silence, MD 12/19/18 [phone]

## 2018-12-18 NOTE — ED Triage Notes (Signed)
Pt presents via POV c/o cough, congestion, cold chills, and body aches x2 days. Reports COVID exposure.

## 2018-12-18 NOTE — Discharge Instructions (Signed)
You were seen today for URI type symptoms.  You are being tested for COVID, we will call you with the results of this test.  We will need you to treat symptomatically with rest, fluids, ibuprofen or Tylenol as needed for fever and body aches.  We recommend that you self quarantine until we call you with your results.  I provided you a work note.

## 2018-12-19 LAB — SARS CORONAVIRUS 2 (TAT 6-24 HRS): SARS Coronavirus 2: POSITIVE — AB

## 2018-12-20 ENCOUNTER — Telehealth: Payer: Self-pay | Admitting: *Deleted

## 2018-12-20 NOTE — Telephone Encounter (Signed)
Pt called in to get his covid-19 test results. His tested positive for the virus. He stated he had the test done at work because there had been someone there that had tested positive for the virus. He denies having any symptoms. He is advised to quarantine for 10 days, check his temp and if he has fever and need medication to quarantine for 3 more days. He voiced understanding. He is advised to notify his pcp, stated he did not have one. Advised him the health department will be notified. He is advised to have the rest of the household tested and to notify his job. He is advised of other symptoms that he experience. He put the call on hold to talk with his friend and got disconnected. Will try to call him back.  Unable to call him back.

## 2019-04-14 ENCOUNTER — Emergency Department
Admission: EM | Admit: 2019-04-14 | Discharge: 2019-04-14 | Disposition: A | Discharge status: Home / Self Care | Payer: Self-pay | Attending: Emergency Medicine | Admitting: Emergency Medicine

## 2019-04-14 ENCOUNTER — Other Ambulatory Visit: Payer: Self-pay

## 2019-04-14 DIAGNOSIS — Z5321 Procedure and treatment not carried out due to patient leaving prior to being seen by health care provider: Secondary | ICD-10-CM | POA: Insufficient documentation

## 2019-04-14 DIAGNOSIS — K0889 Other specified disorders of teeth and supporting structures: Secondary | ICD-10-CM | POA: Insufficient documentation

## 2019-04-14 NOTE — ED Triage Notes (Signed)
Pt to the er for dental pain to the bottom right. Pt states he has a dental appt on Tuesday at [facility] but is in pain now.

## 2019-04-14 NOTE — ED Notes (Signed)
Pt to STAT desk. States he is going to urgent care in the morning. Wait time too long.

## 2019-06-15 ENCOUNTER — Emergency Department
Admission: EM | Admit: 2019-06-15 | Discharge: 2019-06-15 | Disposition: A | Discharge status: Home / Self Care | Payer: Self-pay | Attending: Emergency Medicine | Admitting: Emergency Medicine

## 2019-06-15 ENCOUNTER — Other Ambulatory Visit: Payer: Self-pay

## 2019-06-15 ENCOUNTER — Encounter: Payer: Self-pay | Admitting: *Deleted

## 2019-06-15 DIAGNOSIS — N3001 Acute cystitis with hematuria: Secondary | ICD-10-CM | POA: Insufficient documentation

## 2019-06-15 DIAGNOSIS — F17210 Nicotine dependence, cigarettes, uncomplicated: Secondary | ICD-10-CM | POA: Insufficient documentation

## 2019-06-15 DIAGNOSIS — R319 Hematuria, unspecified: Secondary | ICD-10-CM

## 2019-06-15 DIAGNOSIS — N3090 Cystitis, unspecified without hematuria: Secondary | ICD-10-CM

## 2019-06-15 LAB — BASIC METABOLIC PANEL
Anion gap: 6 (ref 5–15)
BUN: 14 mg/dL (ref 6–20)
CO2: 32 mmol/L (ref 22–32)
Calcium: 9.6 mg/dL (ref 8.9–10.3)
Chloride: 100 mmol/L (ref 98–111)
Creatinine, Ser: 1.23 mg/dL (ref 0.61–1.24)
GFR calc Af Amer: >60 mL/min (ref >60)
GFR calc non Af Amer: >60 mL/min (ref >60)
Glucose, Bld: 76 mg/dL (ref 70–99)
Potassium: 4.2 mmol/L (ref 3.5–5.1)
Sodium: 138 mmol/L (ref 135–145)

## 2019-06-15 LAB — CBC
HCT: 43.9 % (ref 39.0–52.0)
Hemoglobin: 13.9 g/dL (ref 13.0–17.0)
MCH: 27.6 pg (ref 26.0–34.0)
MCHC: 31.7 g/dL (ref 30.0–36.0)
MCV: 87.1 fL (ref 80.0–100.0)
Platelets: 225 10*3/uL (ref 150–400)
RBC: 5.04 MIL/uL (ref 4.22–5.81)
RDW: 14.3 % (ref 11.5–15.5)
WBC: 5.2 10*3/uL (ref 4.0–10.5)
nRBC: 0 % (ref 0.0–0.2)

## 2019-06-15 LAB — URINALYSIS, COMPLETE (UACMP) WITH MICROSCOPIC
Bacteria, UA: NONE SEEN
Bilirubin Urine: NEGATIVE
Glucose, UA: NEGATIVE mg/dL
Ketones, ur: NEGATIVE mg/dL
Nitrite: NEGATIVE
Protein, ur: NEGATIVE mg/dL
RBC / HPF: >50 RBC/hpf — ABNORMAL HIGH (ref 0–5)
Specific Gravity, Urine: 1.018 (ref 1.005–1.030)
pH: 6 (ref 5.0–8.0)

## 2019-06-15 LAB — CHLAMYDIA/NGC RT PCR (ARMC ONLY)??????????: N gonorrhoeae: NOT DETECTED

## 2019-06-15 LAB — CHLAMYDIA/NGC RT PCR (ARMC ONLY): Chlamydia Tr: NOT DETECTED

## 2019-06-15 MED ORDER — CIPROFLOXACIN HCL 500 MG PO TABS: 500.0000 mg | ORAL_TABLET | Freq: Two times a day (BID) | ORAL | 0 refills | Status: AC

## 2019-06-15 MED ORDER — AZITHROMYCIN 500 MG PO TABS
1000.0000 mg | ORAL_TABLET | Freq: Once | ORAL | Status: AC
  Administered 2019-06-15: 1000 mg via ORAL
  Filled 2019-06-15: qty 2

## 2019-06-15 MED ORDER — CIPROFLOXACIN HCL 500 MG PO TABS: 500.0000 mg | ORAL_TABLET | Freq: Two times a day (BID) | ORAL | 0 refills | Status: DC

## 2019-06-15 MED ORDER — CIPROFLOXACIN HCL 500 MG PO TABS
500.0000 mg | ORAL_TABLET | Freq: Once | ORAL | Status: AC
  Administered 2019-06-15: 500 mg via ORAL
  Filled 2019-06-15: qty 1

## 2019-06-15 NOTE — ED Notes (Signed)
Patient up to first nurse desk, given update about his wait, reassured that labs are back.

## 2019-06-15 NOTE — ED Provider Notes (Signed)
[facility] Emergency Department Provider Note       Time seen: ----------------------------------------- 8:48 PM on 06/15/2019 -----------------------------------------   I have reviewed the triage vital signs and the nursing notes.  HISTORY  Chief Complaint Hematuria    HPI Juan Best is a 39 y.o. male with a history of arthritis, GERD who presents to the ED for hematuria since last night.  He has not had any back pain, reports penile discharge.  Discomfort is 9 out of 10.  He has had this happen once in the past, states he was diagnosed with a UTI.  He denies any other symptoms.  Past Medical History:  Diagnosis Date  . Arthritis   . GERD (gastroesophageal reflux disease)     There are no problems to display for this patient.   Past Surgical History:  Procedure Laterality Date  . ANKLE SURGERY      Allergies Sulfa antibiotics  Social History Social History   Tobacco Use  . Smoking status: Current Every Day Smoker    Packs/day: 0.50    Types: Cigarettes  . Smokeless tobacco: Never Used  Substance Use Topics  . Alcohol use: Yes    Comment: two 40 oz beers a day  . Drug use: No    Review of Systems Constitutional: Negative for fever. Cardiovascular: Negative for chest pain. Respiratory: Negative for shortness of breath. Gastrointestinal: Negative for abdominal pain, vomiting and diarrhea. Genitourinary: Positive for hematuria Musculoskeletal: Negative for back pain. Skin: Negative for rash. Neurological: Negative for headaches, focal weakness or numbness.  All systems negative/normal/unremarkable except as stated in the HPI  ____________________________________________   PHYSICAL EXAM:  VITAL SIGNS: ED Triage Vitals  Enc Vitals Group     BP 06/15/19 1826 (!) 120/91     Pulse Rate 06/15/19 1823 82     Resp 06/15/19 1823 18     Temp 06/15/19 1823 98 F (36.7 C)     Temp Source 06/15/19 1823 Oral     SpO2 06/15/19  1823 100 %     Weight 06/15/19 1824 135 lb (61.2 kg)     Height 06/15/19 1824 5\' 9"  (1.753 m)     Head Circumference --      Peak Flow --      Pain Score 06/15/19 1824 9     Pain Loc --      Pain Edu? --      Excl. in GC? --     Constitutional: Alert and oriented. Well appearing and in no distress. Eyes: Conjunctivae are normal. Normal extraocular movements. Cardiovascular: Normal rate, regular rhythm. No murmurs, rubs, or gallops. Respiratory: Normal respiratory effort without tachypnea nor retractions. Breath sounds are clear and equal bilaterally. No wheezes/rales/rhonchi. Gastrointestinal: Soft and nontender. Normal bowel sounds Genitourinary: Normal exam, circumcised, no lesions or blood present Musculoskeletal: Nontender with normal range of motion in extremities. No lower extremity tenderness nor edema. Neurologic:  Normal speech and language. No gross focal neurologic deficits are appreciated.  Skin:  Skin is warm, dry and intact. No rash noted. Psychiatric: Mood and affect are normal. Speech and behavior are normal.  ____________________________________________  ED COURSE:  As part of my medical decision making, I reviewed the following data within the electronic MEDICAL RECORD NUMBER History obtained from family if available, nursing notes, old chart and ekg, as well as notes from prior ED visits. Patient presented for hematuria, we will assess with labs and imaging as indicated at this time.   Procedures  CORDAI RODRIGUE was evaluated in Emergency Department on 06/15/2019 for the symptoms described in the history of present illness. He was evaluated in the context of the global COVID-19 pandemic, which necessitated consideration that the patient might be at risk for infection with the SARS-CoV-2 virus that causes COVID-19. Institutional protocols and algorithms that pertain to the evaluation of patients at risk for COVID-19 are in a state of rapid change based on information  released by regulatory bodies including the CDC and federal and state organizations. These policies and algorithms were followed during the patient's care in the ED.  ____________________________________________   LABS (pertinent positives/negatives)  Labs Reviewed  URINALYSIS, COMPLETE (UACMP) WITH MICROSCOPIC - Abnormal; Notable for the following components:      Result Value   Color, Urine YELLOW (*)    APPearance CLEAR (*)    Hgb urine dipstick SMALL (*)    Leukocytes,Ua SMALL (*)    RBC / HPF >50 (*)    All other components within normal limits  URINE CULTURE  CHLAMYDIA/NGC RT PCR ([facility] ONLY)  BASIC METABOLIC PANEL  CBC   ____________________________________________   DIFFERENTIAL DIAGNOSIS   Hematuria, UTI, STI, renal colic unlikely  FINAL ASSESSMENT AND PLAN  Hematuria   Plan: The patient had presented for hematuria and clinically cystitis. Patient's labs revealed significant hematuria and some elevated white blood cells as well.  No obvious bacteria.  Patient will be treated with antibiotics and will be referred to urology for follow-up.   Laurence Aly, MD    Note: This note was generated in part or whole with voice recognition software. Voice recognition is usually quite accurate but there are transcription errors that can and very often do occur. I apologize for any typographical errors that were not detected and corrected.     Earleen Newport, MD 06/15/19 2116

## 2019-06-15 NOTE — ED Triage Notes (Signed)
Pt states blood in urine since last night.  No back pain.  Pt also reports penile discharge.  Pt alert, speech clear.

## 2019-06-16 LAB — URINE CULTURE
Culture: NO GROWTH
Special Requests: NORMAL

## 2019-06-22 ENCOUNTER — Ambulatory Visit: Payer: Self-pay | Admitting: Urology

## 2019-06-22 ENCOUNTER — Encounter: Payer: Self-pay | Admitting: Urology

## 2019-07-24 ENCOUNTER — Ambulatory Visit: Payer: Self-pay | Attending: Internal Medicine

## 2019-07-24 DIAGNOSIS — Z23 Encounter for immunization: Secondary | ICD-10-CM

## 2019-07-24 NOTE — Progress Notes (Signed)
   Covid-19 Vaccination Clinic  Name:  [name]    MRN: [id number] DOB: [birth year]  07/24/2019  Mr. Maese was observed post Covid-19 immunization for 15 minutes without incident. He was provided with Vaccine Information Sheet and instruction to access the V-Safe system.   Mr. Eudy was instructed to call 911 with any severe reactions post vaccine: Marland Kitchen Difficulty breathing  . Swelling of face and throat  . A fast heartbeat  . A bad rash all over body  . Dizziness and weakness   Immunizations Administered    Name Date Dose VIS Date Route   Pfizer COVID-19 Vaccine 07/24/2019  5:43 PM 0.3 mL 04/08/2019 Intramuscular   Manufacturer: [employer], [employer]   Lot: [id number]   NDC: [address]-5

## 2019-08-14 ENCOUNTER — Ambulatory Visit: Payer: Self-pay

## 2019-08-15 ENCOUNTER — Ambulatory Visit: Payer: Self-pay | Attending: Internal Medicine

## 2019-08-15 DIAGNOSIS — Z23 Encounter for immunization: Secondary | ICD-10-CM

## 2019-08-15 NOTE — Progress Notes (Signed)
   Covid-19 Vaccination Clinic  Name:  Juan Best    MRN: [id number] DOB: June 23, 1980  08/15/2019  [name] was observed post Covid-19 immunization for 15 minutes without incident. He was provided with Vaccine Information Sheet and instruction to access the V-Safe system.   [name] was instructed to call 911 with any severe reactions post vaccine: Marland Kitchen Difficulty breathing  . Swelling of face and throat  . A fast heartbeat  . A bad rash all over body  . Dizziness and weakness   Immunizations Administered    Name Date Dose VIS Date Route   Pfizer COVID-19 Vaccine 08/15/2019  3:24 PM 0.3 mL 06/22/2018 Intramuscular   Manufacturer: [employer], [employer]   Lot: [id number]   NDC: [address]-1

## 2019-08-29 ENCOUNTER — Emergency Department: Payer: BC Managed Care – PPO

## 2019-08-29 ENCOUNTER — Emergency Department
Admission: EM | Admit: 2019-08-29 | Discharge: 2019-08-29 | Disposition: A | Discharge status: Home / Self Care | Payer: BC Managed Care – PPO | Attending: Student | Admitting: Student

## 2019-08-29 ENCOUNTER — Encounter: Payer: Self-pay | Admitting: *Deleted

## 2019-08-29 ENCOUNTER — Other Ambulatory Visit: Payer: Self-pay

## 2019-08-29 DIAGNOSIS — M25511 Pain in right shoulder: Secondary | ICD-10-CM | POA: Insufficient documentation

## 2019-08-29 DIAGNOSIS — M1909 Primary osteoarthritis, other specified site: Secondary | ICD-10-CM | POA: Insufficient documentation

## 2019-08-29 MED ORDER — MELOXICAM 15 MG PO TABS: 15.0000 mg | ORAL_TABLET | Freq: Every day | ORAL | 0 refills | Status: DC

## 2019-08-29 MED ORDER — KETOROLAC TROMETHAMINE 30 MG/ML IJ SOLN
30.0000 mg | Freq: Once | INTRAMUSCULAR | Status: AC
  Administered 2019-08-29: 18:00:00 30 mg via INTRAMUSCULAR
  Filled 2019-08-29: qty 1

## 2019-08-29 NOTE — ED Triage Notes (Signed)
Pt has right shoulder pain for 2 weeks.  Pt taking otc meds without relief.  States lifts heavy objects from outside.  No recent injury to shoulder.  Good rom.  Pt alert.

## 2019-08-29 NOTE — ED Notes (Signed)
See triage note  Presents with pain to right shoulder  States he has not had an injury   States he does a lot of lifting  Pain is to right shoulder and moves into posterior shoulder  Good pulses

## 2019-08-29 NOTE — Discharge Instructions (Signed)
Call make an appointment with the orthopedic department at [facility] if any continued problems or not improving.  A prescription for meloxicam was sent to the pharmacy.  This medication is once a day every day for the next 7 to 10 days to see if this helps with your shoulder pain.  Make sure to eat with this medication.  You may also apply ice to your shoulder as needed for discomfort.

## 2019-08-29 NOTE — ED Provider Notes (Signed)
[facility] Emergency Department Provider Note   ____________________________________________   First MD Initiated Contact with Patient 08/29/19 1649     (approximate)  I have reviewed the triage vital signs and the nursing notes.   HISTORY  Chief Complaint Shoulder Pain    HPI Juan Best is a 39 y.o. male presents to the ED with complaint of right shoulder pain for the last 2 weeks.  Patient states that it has been hurting him for "years".  Patient denies any direct trauma to his shoulder.  He states that he has pain when he lifts heavy objects.  He has not taken any over-the-counter medication.  He denies any previous fractures or injuries to his shoulder growing up.  Currently rates pain as 9 out of 10.       Past Medical History:  Diagnosis Date   Arthritis    GERD (gastroesophageal reflux disease)     There are no problems to display for this patient.   Past Surgical History:  Procedure Laterality Date   ANKLE SURGERY      Prior to Admission medications   Medication Sig Start Date End Date Taking? Authorizing Provider  meloxicam (MOBIC) 15 MG tablet Take 1 tablet (15 mg total) by mouth daily. 08/29/19 08/28/20  Johnn Hai, PA-C    Allergies Sulfa antibiotics  Family History  Problem Relation Age of Onset   Healthy Mother    Healthy Father     Social History Social History   Tobacco Use   Smoking status: Current Every Day Smoker    Packs/day: 0.50    Types: Cigarettes   Smokeless tobacco: Never Used  Substance Use Topics   Alcohol use: Yes    Comment: two 40 oz beers a day   Drug use: No    Review of Systems Constitutional: No fever/chills Cardiovascular: Denies chest pain. Respiratory: Denies shortness of breath. Gastrointestinal:   No nausea, no vomiting.  Musculoskeletal: Complaint of right shoulder pain. Skin: Negative for rash. Neurological: Negative for headaches, focal weakness or  numbness.  ____________________________________________   PHYSICAL EXAM:  VITAL SIGNS: ED Triage Vitals  Enc Vitals Group     BP 08/29/19 1637 101/79     Pulse Rate 08/29/19 1637 (!) 56     Resp 08/29/19 1637 18     Temp 08/29/19 1637 98 F (36.7 C)     Temp Source 08/29/19 1637 Oral     SpO2 08/29/19 1637 100 %     Weight 08/29/19 1638 135 lb (61.2 kg)     Height 08/29/19 1638 5\' 9"  (1.753 m)     Head Circumference --      Peak Flow --      Pain Score 08/29/19 1638 9     Pain Loc --      Pain Edu? --      Excl. in [place]? --     Constitutional: Alert and oriented. Well appearing and in no acute distress. Eyes: Conjunctivae are normal.  Head: Atraumatic. Neck: No stridor.   Cardiovascular: Normal rate, regular rhythm. Grossly normal heart sounds.  Good peripheral circulation. Respiratory: Normal respiratory effort.  No retractions. Lungs CTAB. Musculoskeletal: On examination of the right shoulder there is no gross deformity noted.  No soft tissue edema or discoloration noted.  No crepitus is noted with range of motion.  Patient is limited with abduction at approximately 45 degrees due to discomfort. Neurologic:  Normal speech and language. No gross focal neurologic deficits  are appreciated. No gait instability. Skin:  Skin is warm, dry and intact. No rash noted. Psychiatric: Mood and affect are normal. Speech and behavior are normal.  ____________________________________________   LABS (all labs ordered are listed, but only abnormal results are displayed)  Labs Reviewed - No data to display  RADIOLOGY   Official radiology report(s): DG Shoulder Right  Result Date: 08/29/2019 CLINICAL DATA:  Chronic right shoulder pain. EXAM: RIGHT SHOULDER - 2+ VIEW COMPARISON:  11/03/2017 FINDINGS: No acute fracture or dislocation is identified. Mild acromioclavicular joint space narrowing is again noted with small marginal osteophytes and subchondral cystic changes, perhaps mildly  progressed from the prior study. The soft tissues are unremarkable. 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 Electronically Signed   By: Sebastian Ache M.D.   On: 08/29/2019 17:25    ____________________________________________   PROCEDURES  Procedure(s) performed (including Critical Care):  Procedures   ____________________________________________   INITIAL IMPRESSION / ASSESSMENT AND PLAN / ED COURSE  As part of my medical decision making, I reviewed the following data within the electronic MEDICAL RECORD NUMBER Notes from prior ED visits and [place] Controlled Substance Database   39 year old male presents to the ED with complaint of right shoulder pain that has been worse in the last 2 weeks.  Patient states he has had shoulder pain off and on for "years".  Patient denies any direct trauma or injury recently to his shoulder.  He states that he lifts heavy objects and gets his shoulder irritated.  He has not taken any over-the-counter medication.  X-ray showed acromioclavicular arthrosis but no acute bony injury.  Patient was made aware.  He was given an injection of Toradol 30 mg IM and a prescription of meloxicam 15 mg 1 daily with food #10.  He is to follow-up with his PCP or Dr. Rosita Kea if he continues to have problems with his shoulder.  ____________________________________________   FINAL CLINICAL IMPRESSION(S) / ED DIAGNOSES  Final diagnoses:  Arthralgia of right acromioclavicular joint     ED Discharge Orders         Ordered    meloxicam (MOBIC) 15 MG tablet  Daily     08/29/19 1812           Note:  This document was prepared using Dragon voice recognition software and may include unintentional dictation errors.    Tommi Rumps, PA-C 08/29/19 1814    Miguel Aschoff., MD 08/29/19 2016

## 2019-09-27 ENCOUNTER — Other Ambulatory Visit: Payer: Self-pay

## 2019-09-27 ENCOUNTER — Encounter: Payer: Self-pay | Admitting: Emergency Medicine

## 2019-09-27 ENCOUNTER — Emergency Department
Admission: EM | Admit: 2019-09-27 | Discharge: 2019-09-27 | Disposition: A | Discharge status: Home / Self Care | Payer: BC Managed Care – PPO | Attending: Emergency Medicine | Admitting: Emergency Medicine

## 2019-09-27 DIAGNOSIS — Z5321 Procedure and treatment not carried out due to patient leaving prior to being seen by health care provider: Secondary | ICD-10-CM | POA: Insufficient documentation

## 2019-09-27 DIAGNOSIS — M25511 Pain in right shoulder: Secondary | ICD-10-CM | POA: Diagnosis present

## 2019-09-27 NOTE — ED Triage Notes (Signed)
Pt to ED from home c/o right shoulder pain that he has been seen here for.  Pt states was told arthritis pain.  Pt has good ROM.

## 2019-09-28 ENCOUNTER — Other Ambulatory Visit: Payer: Self-pay

## 2019-09-28 ENCOUNTER — Emergency Department: Payer: BC Managed Care – PPO

## 2019-09-28 ENCOUNTER — Emergency Department
Admission: EM | Admit: 2019-09-28 | Discharge: 2019-09-28 | Disposition: A | Discharge status: Home / Self Care | Payer: BC Managed Care – PPO | Attending: Student | Admitting: Student

## 2019-09-28 ENCOUNTER — Encounter: Payer: Self-pay | Admitting: *Deleted

## 2019-09-28 DIAGNOSIS — F17210 Nicotine dependence, cigarettes, uncomplicated: Secondary | ICD-10-CM | POA: Insufficient documentation

## 2019-09-28 DIAGNOSIS — M7581 Other shoulder lesions, right shoulder: Secondary | ICD-10-CM

## 2019-09-28 DIAGNOSIS — M25511 Pain in right shoulder: Secondary | ICD-10-CM | POA: Diagnosis present

## 2019-09-28 DIAGNOSIS — M7541 Impingement syndrome of right shoulder: Secondary | ICD-10-CM | POA: Diagnosis not present

## 2019-09-28 MED ORDER — PREDNISONE 50 MG PO TABS: 50.0000 mg | ORAL_TABLET | Freq: Every day | ORAL | 0 refills | Status: DC

## 2019-09-28 MED ORDER — MELOXICAM 7.5 MG PO TABS: 15.0000 mg | ORAL_TABLET | Freq: Once | ORAL | Status: DC

## 2019-09-28 MED ORDER — MELOXICAM 15 MG PO TABS: 15.0000 mg | ORAL_TABLET | Freq: Every day | ORAL | 0 refills | Status: DC

## 2019-09-28 MED ORDER — PREDNISONE 20 MG PO TABS: 60.0000 mg | ORAL_TABLET | Freq: Once | ORAL | Status: DC

## 2019-09-28 NOTE — ED Triage Notes (Signed)
Pt has right shoulder pain.  No known injury.  Good rom.  Pt was seen here recently for similar sx.  Pt alert.

## 2019-09-28 NOTE — ED Provider Notes (Signed)
[facility] Emergency Department Provider Note  ____________________________________________  Time seen: Approximately 11:20 PM  I have reviewed the triage vital signs and the nursing notes.   HISTORY  Chief Complaint Shoulder Pain    HPI Juan Best is a 39 y.o. male who presents the emergency department complaining of right shoulder pain.  Pain has been intermittent x5 years.  Patient was diagnosed with arthritis of the shoulder.  Patient states that he has been on medication clean prescription as well as over-the-counter with no relief of his symptoms.  Patient is looking for "answers" as well as definitive treatment.  No direct trauma to the shoulder.  No radicular symptoms.  Patient denies any other complaint other than anterior and superior right shoulder pain.         Past Medical History:  Diagnosis Date   Arthritis    GERD (gastroesophageal reflux disease)     There are no problems to display for this patient.   Past Surgical History:  Procedure Laterality Date   ANKLE SURGERY      Prior to Admission medications   Medication Sig Start Date End Date Taking? Authorizing Provider  meloxicam (MOBIC) 15 MG tablet Take 1 tablet (15 mg total) by mouth daily. 09/28/19   Cassandra Mcmanaman, Delorise Royals, PA-C  predniSONE (DELTASONE) 50 MG tablet Take 1 tablet (50 mg total) by mouth daily with breakfast. 09/28/19   Praveen Coia, Delorise Royals, PA-C    Allergies Sulfa antibiotics  Family History  Problem Relation Age of Onset   Healthy Mother    Healthy Father     Social History Social History   Tobacco Use   Smoking status: Current Every Day Smoker    Packs/day: 0.50    Types: Cigarettes   Smokeless tobacco: Never Used  Substance Use Topics   Alcohol use: Yes   Drug use: No     Review of Systems  Constitutional: No fever/chills Eyes: No visual changes. No discharge ENT: No upper respiratory complaints. Cardiovascular: no chest  pain. Respiratory: no cough. No SOB. Gastrointestinal: No abdominal pain.  No nausea, no vomiting.  No diarrhea.  No constipation. Musculoskeletal: Positive for ongoing, worsening shoulder pain Skin: Negative for rash, abrasions, lacerations, ecchymosis. Neurological: Negative for headaches, focal weakness or numbness. 10-point ROS otherwise negative.  ____________________________________________   PHYSICAL EXAM:  VITAL SIGNS: ED Triage Vitals [09/28/19 2124]  Enc Vitals Group     BP 134/86     Pulse Rate 88     Resp 18     Temp 98.7 F (37.1 C)     Temp Source Oral     SpO2 100 %     Weight 135 lb (61.2 kg)     Height 5\' 9"  (1.753 m)     Head Circumference      Peak Flow      Pain Score 9     Pain Loc      Pain Edu?      Excl. in [place]?      Constitutional: Alert and oriented. Well appearing and in no acute distress. Eyes: Conjunctivae are normal. PERRL. EOMI. Head: Atraumatic. ENT:      Ears:       Nose: No congestion/rhinnorhea.      Mouth/Throat: Mucous membranes are moist.  Neck: No stridor.  No cervical spine tenderness to palpation.  Cardiovascular: Normal rate, regular rhythm. Normal S1 and S2.  Good peripheral circulation. Respiratory: Normal respiratory effort without tachypnea or retractions. Lungs CTAB. Good  air entry to the bases with no decreased or absent breath sounds. Musculoskeletal: Full range of motion to all extremities. No gross deformities appreciated.  Visualization of the right shoulder reveals no visible abnormality.  No edema, ecchymosis, deformity.  Good range of motion.  Patient is tender to palpation of the acromioclavicular joint space as well as the extension of the rotator cuff from acromioclavicular joint space into the superior aspect of the shoulder.  No palpable abnormalities or deficits.  No other tenderness to palpation.  Examination of the cervical spine, elbow, wrist is unremarkable.  Radial pulse intact distally.  Sensation intact  distally. Neurologic:  Normal speech and language. No gross focal neurologic deficits are appreciated.  Skin:  Skin is warm, dry and intact. No rash noted. Psychiatric: Mood and affect are normal. Speech and behavior are normal. Patient exhibits appropriate insight and judgement.   ____________________________________________   LABS (all labs ordered are listed, but only abnormal results are displayed)  Labs Reviewed - No data to display ____________________________________________  EKG   ____________________________________________  RADIOLOGY I personally viewed and evaluated these images as part of my medical decision making, as well as reviewing the written report by the radiologist.  DG Shoulder Right  Result Date: 09/28/2019 CLINICAL DATA:  Right shoulder pain, no known injury, initial encounter EXAM: RIGHT SHOULDER - 2+ VIEW COMPARISON:  None. FINDINGS: No acute fracture or dislocation is noted. No soft tissue abnormality is noted. 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 Old rib fractures are noted involving sixth rib posterolaterally. IMPRESSION: No acute abnormality noted. Electronically Signed   By: Inez Catalina M.D.   On: 09/28/2019 22:06    ____________________________________________    PROCEDURES  Procedure(s) performed:    Procedures    Medications - No data to display   ____________________________________________   INITIAL IMPRESSION / ASSESSMENT AND PLAN / ED COURSE  Pertinent labs & imaging results that were available during my care of the patient were reviewed by me and considered in my medical decision making (see chart for details).  Review of the [place] CSRS was performed in accordance of the [place] prior to dispensing any controlled drugs.           Patient's diagnosis is consistent with rotator cuff tendinitis and impingement syndrome.  Patient presented to the emergency department complaining of ongoing shoulder pain.  This has been a  chronically worsening complaint over the past several years.  No acute injuries.  Imaging reveals no significant findings.  Given patient's symptoms I suspect rotator cuff tendinitis and impingement syndrome.  Patient will be started on steroid and anti-inflammatory but referred to orthopedics for further management..  Patient is given ED precautions to return to the ED for any worsening or new symptoms.     ____________________________________________  FINAL CLINICAL IMPRESSION(S) / ED DIAGNOSES  Final diagnoses:  Tendinitis of right rotator cuff  Impingement syndrome of right shoulder      NEW MEDICATIONS STARTED DURING THIS VISIT:  ED Discharge Orders         Ordered    meloxicam (MOBIC) 15 MG tablet  Daily     09/28/19 2333    predniSONE (DELTASONE) 50 MG tablet  Daily with breakfast     09/28/19 2333              This chart was dictated using voice recognition software/Dragon. Despite best efforts to proofread, errors can occur which can change the meaning. Any change was purely unintentional.    Skyleen Bentley, Roderic Palau  D, PA-C 09/28/19 2341    Miguel Aschoff., MD 09/29/19 [phone]

## 2019-09-28 NOTE — ED Notes (Signed)
Pt given ginger ale after requesting a drink. Pt in NAD and updated on delay for discharge papers.

## 2019-10-13 ENCOUNTER — Encounter: Payer: Self-pay | Admitting: Emergency Medicine

## 2019-10-13 ENCOUNTER — Other Ambulatory Visit: Payer: Self-pay

## 2019-10-13 ENCOUNTER — Emergency Department
Admission: EM | Admit: 2019-10-13 | Discharge: 2019-10-13 | Disposition: A | Discharge status: Home / Self Care | Payer: BC Managed Care – PPO | Attending: Emergency Medicine | Admitting: Emergency Medicine

## 2019-10-13 DIAGNOSIS — N3090 Cystitis, unspecified without hematuria: Secondary | ICD-10-CM | POA: Diagnosis not present

## 2019-10-13 DIAGNOSIS — F17210 Nicotine dependence, cigarettes, uncomplicated: Secondary | ICD-10-CM | POA: Diagnosis not present

## 2019-10-13 DIAGNOSIS — R300 Dysuria: Secondary | ICD-10-CM | POA: Diagnosis present

## 2019-10-13 LAB — CHLAMYDIA/NGC RT PCR (ARMC ONLY)
Chlamydia Tr: NOT DETECTED
N gonorrhoeae: NOT DETECTED

## 2019-10-13 LAB — URINALYSIS, COMPLETE (UACMP) WITH MICROSCOPIC
Bilirubin Urine: NEGATIVE
Glucose, UA: NEGATIVE mg/dL
Hgb urine dipstick: NEGATIVE
Ketones, ur: NEGATIVE mg/dL
Nitrite: NEGATIVE
Protein, ur: NEGATIVE mg/dL
Specific Gravity, Urine: 1.023 (ref 1.005–1.030)
pH: 6 (ref 5.0–8.0)

## 2019-10-13 MED ORDER — MELOXICAM 15 MG PO TABS
15.0000 mg | ORAL_TABLET | Freq: Every day | ORAL | 0 refills | Status: DC
Start: 2019-10-13 — End: 2020-05-29

## 2019-10-13 MED ORDER — CIPROFLOXACIN HCL 500 MG PO TABS
500.0000 mg | ORAL_TABLET | Freq: Two times a day (BID) | ORAL | 0 refills | Status: AC
Start: 2019-10-13 — End: 2019-10-23

## 2019-10-13 NOTE — ED Notes (Signed)
See triage note  Presents with some urinary pressure   And voiding small amts   Denies any discharge or fever

## 2019-10-13 NOTE — ED Provider Notes (Signed)
[facility] Emergency Department Provider Note  ____________________________________________  Time seen: Approximately 5:43 PM  I have reviewed the triage vital signs and the nursing notes.   HISTORY  Chief Complaint Dysuria    HPI Juan Best is a 39 y.o. male who presents the emergency department complaining of decreased urinary stream.  Patient states that he has noticed that when he urinates, his stream is not as forceful as it usually is.  Patient denies any associated symptoms of dysuria, polyuria, hematuria, penile drainage or discharge.  No testicular pain.  Patient denies being dehydrated.  No other complaints other than decreased urinary stream from normal.         Past Medical History:  Diagnosis Date   Arthritis    GERD (gastroesophageal reflux disease)     There are no problems to display for this patient.   Past Surgical History:  Procedure Laterality Date   ANKLE SURGERY      Prior to Admission medications   Medication Sig Start Date End Date Taking? Authorizing Provider  ciprofloxacin (CIPRO) 500 MG tablet Take 1 tablet (500 mg total) by mouth 2 (two) times daily for 10 days. 10/13/19 10/23/19  Toi Stelly, Delorise Royals, PA-C  meloxicam (MOBIC) 15 MG tablet Take 1 tablet (15 mg total) by mouth daily. 10/13/19   Diavian Furgason, Delorise Royals, PA-C    Allergies Sulfa antibiotics  Family History  Problem Relation Age of Onset   Healthy Mother    Healthy Father     Social History Social History   Tobacco Use   Smoking status: Current Every Day Smoker    Packs/day: 0.50    Types: Cigarettes   Smokeless tobacco: Never Used  Vaping Use   Vaping Use: Never used  Substance Use Topics   Alcohol use: Yes   Drug use: No     Review of Systems  Constitutional: No fever/chills Eyes: No visual changes. No discharge ENT: No upper respiratory complaints. Cardiovascular: no chest pain. Respiratory: no cough. No  SOB. Gastrointestinal: No abdominal pain.  No nausea, no vomiting.  No diarrhea.  No constipation. Genitourinary: Negative for dysuria. No hematuria.  No polyuria.  No penile drainage.  No testicular pain.  Positive for decreased urinary stream. Musculoskeletal: Negative for musculoskeletal pain. Skin: Negative for rash, abrasions, lacerations, ecchymosis. Neurological: Negative for headaches, focal weakness or numbness. 10-point ROS otherwise negative.  ____________________________________________   PHYSICAL EXAM:  VITAL SIGNS: ED Triage Vitals  Enc Vitals Group     BP 10/13/19 1647 132/85     Pulse Rate 10/13/19 1647 61     Resp 10/13/19 1647 16     Temp 10/13/19 1647 98.4 F (36.9 C)     Temp Source 10/13/19 1647 Oral     SpO2 10/13/19 1647 100 %     Weight 10/13/19 1648 135 lb (61.2 kg)     Height 10/13/19 1648 5\' 9"  (1.753 m)     Head Circumference --      Peak Flow --      Pain Score 10/13/19 1648 9     Pain Loc --      Pain Edu? --      Excl. in [place]? --      Constitutional: Alert and oriented. Well appearing and in no acute distress. Eyes: Conjunctivae are normal. PERRL. EOMI. Head: Atraumatic. ENT:      Ears:       Nose: No congestion/rhinnorhea.      Mouth/Throat: Mucous membranes are moist.  Neck: No stridor.    Cardiovascular: Normal rate, regular rhythm. Normal S1 and S2.  Good peripheral circulation. Respiratory: Normal respiratory effort without tachypnea or retractions. Lungs CTAB. Good air entry to the bases with no decreased or absent breath sounds. Gastrointestinal: Bowel sounds 4 quadrants. Soft and nontender to palpation. No guarding or rigidity. No palpable masses. No distention. No CVA tenderness. Musculoskeletal: Full range of motion to all extremities. No gross deformities appreciated. Neurologic:  Normal speech and language. No gross focal neurologic deficits are appreciated.  Skin:  Skin is warm, dry and intact. No rash noted. Psychiatric:  Mood and affect are normal. Speech and behavior are normal. Patient exhibits appropriate insight and judgement.   ____________________________________________   LABS (all labs ordered are listed, but only abnormal results are displayed)  Labs Reviewed  URINALYSIS, COMPLETE (UACMP) WITH MICROSCOPIC - Abnormal; Notable for the following components:      Result Value   Color, Urine YELLOW (*)    APPearance CLEAR (*)    Leukocytes,Ua MODERATE (*)    Bacteria, UA RARE (*)    All other components within normal limits  CHLAMYDIA/NGC RT PCR ([facility] ONLY)  URINE CULTURE   ____________________________________________  EKG   ____________________________________________  RADIOLOGY   No results found.  ____________________________________________    PROCEDURES  Procedure(s) performed:    Procedures    Medications - No data to display   ____________________________________________   INITIAL IMPRESSION / ASSESSMENT AND PLAN / ED COURSE  Pertinent labs & imaging results that were available during my care of the patient were reviewed by me and considered in my medical decision making (see chart for details).  Review of the [place] CSRS was performed in accordance of the [facility] prior to dispensing any controlled drugs.           Patient's diagnosis is consistent with cystitis.  Patient presented to emergency department with decreased urinary stream.  Patient with no dysuria, polyuria, hematuria.  No other complaints.  Patient did have some mild leukocytes and rare bacteria in the urine.  At this time I feel this is more likely cystitis versus true urinary tract infection.  I will initially treat conservatively with increasing the patient's oral intake of fluids, anti-inflammatory.  I will culture the urine.  If this results concerning for urinary tract infection patient may start Cipro.  If patient symptoms improve, and no evidence of of growth, he should not start the  antibiotic.  I discussed these plans with the patient he is agreeable to do same.  He states that he does have similar symptoms when "I do not drink enough.".  Follow-up primary care as needed.  Patient is given ED precautions to return to the ED for any worsening or new symptoms.     ____________________________________________  FINAL CLINICAL IMPRESSION(S) / ED DIAGNOSES  Final diagnoses:  Cystitis      NEW MEDICATIONS STARTED DURING THIS VISIT:  ED Discharge Orders         Ordered    ciprofloxacin (CIPRO) 500 MG tablet  2 times daily     Discontinue  Reprint     10/13/19 2023    meloxicam (MOBIC) 15 MG tablet  Daily     Discontinue  Reprint     10/13/19 2025              This chart was dictated using voice recognition software/Dragon. Despite best efforts to proofread, errors can occur which can change the meaning. Any change was purely unintentional.  Brynda Peon 10/13/19 2026    Nance Pear, MD 10/13/19 2152

## 2019-10-13 NOTE — Discharge Instructions (Signed)
Start the meloxicam prescription.  If symptoms improve and you do not hear from [place] you do not need the antibiotic.  If symptoms do not improve or you hear from [place], start the antibiotic.

## 2019-10-13 NOTE — ED Triage Notes (Signed)
Patient presents to the ED for dysuria, urinary frequency and halting urination.  Patient states he had a UTI about a month ago and he was given antibiotics.

## 2019-10-15 LAB — URINE CULTURE: Culture: NO GROWTH

## 2020-02-20 ENCOUNTER — Other Ambulatory Visit: Payer: Self-pay

## 2020-02-20 ENCOUNTER — Emergency Department
Admission: EM | Admit: 2020-02-20 | Discharge: 2020-02-20 | Disposition: A | Discharge status: Home / Self Care | Payer: BC Managed Care – PPO | Attending: Emergency Medicine | Admitting: Emergency Medicine

## 2020-02-20 DIAGNOSIS — L259 Unspecified contact dermatitis, unspecified cause: Secondary | ICD-10-CM

## 2020-02-20 DIAGNOSIS — F17210 Nicotine dependence, cigarettes, uncomplicated: Secondary | ICD-10-CM | POA: Insufficient documentation

## 2020-02-20 DIAGNOSIS — R21 Rash and other nonspecific skin eruption: Secondary | ICD-10-CM | POA: Insufficient documentation

## 2020-02-20 MED ORDER — HYDROXYZINE HCL 25 MG PO TABS
25.0000 mg | ORAL_TABLET | Freq: Once | ORAL | Status: AC
  Administered 2020-02-20: 25 mg via ORAL
  Filled 2020-02-20: qty 1

## 2020-02-20 MED ORDER — PREDNISONE 10 MG (21) PO TBPK
ORAL_TABLET | ORAL | 0 refills | Status: DC
Start: 2020-02-20 — End: 2020-07-24

## 2020-02-20 MED ORDER — HYDROXYZINE HCL 25 MG PO TABS
25.0000 mg | ORAL_TABLET | Freq: Three times a day (TID) | ORAL | 0 refills | Status: DC | PRN
Start: 2020-02-20 — End: 2020-07-24

## 2020-02-20 NOTE — ED Provider Notes (Signed)
[facility] Emergency Department Provider Note  ____________________________________________  Time seen: Approximately 11:24 AM  I have reviewed the triage vital signs and the nursing notes.   HISTORY  Chief Complaint Rash   HPI Juan Best is a 39 y.o. male who presents to the emergency department for treatment and evaluation of rash.  Symptoms started a few days ago.  He believes that he was exposed to poison ivy.  Rash has spread over the last 24 hours and itching has worsened.  No alleviating measures attempted prior to arrival.  Past Medical History:  Diagnosis Date  . Arthritis   . GERD (gastroesophageal reflux disease)     There are no problems to display for this patient.   Past Surgical History:  Procedure Laterality Date  . ANKLE SURGERY      Prior to Admission medications   Medication Sig Start Date End Date Taking? Authorizing Provider  meloxicam (MOBIC) 15 MG tablet Take 1 tablet (15 mg total) by mouth daily. 10/13/19   Cuthriell, Delorise Royals, PA-C    Allergies Sulfa antibiotics  Family History  Problem Relation Age of Onset  . Healthy Mother   . Healthy Father     Social History Social History   Tobacco Use  . Smoking status: Current Every Day Smoker    Packs/day: 0.50    Types: Cigarettes  . Smokeless tobacco: Never Used  Vaping Use  . Vaping Use: Never used  Substance Use Topics  . Alcohol use: Yes  . Drug use: No    Review of Systems  Constitutional: Negative for fever. Respiratory: Negative for cough or shortness of breath.  Musculoskeletal: Negative for myalgias Skin: Positive for rash Neurological: Negative for numbness or paresthesias. ____________________________________________   PHYSICAL EXAM:  VITAL SIGNS: ED Triage Vitals [02/20/20 1112]  Enc Vitals Group     BP 122/89     Pulse Rate 66     Resp 16     Temp 97.7 F (36.5 C)     Temp Source Oral     SpO2 100 %     Weight      Height       Head Circumference      Peak Flow      Pain Score      Pain Loc      Pain Edu?      Excl. in GC?      Constitutional: Overall well appearing. Eyes: Conjunctivae are clear without discharge or drainage. Nose: No rhinorrhea noted. Mouth/Throat: Airway is patent.  Neck: No stridor. Unrestricted range of motion observed. Cardiovascular: Capillary refill is <3 seconds.  Respiratory: Respirations are even and unlabored.. Musculoskeletal: Unrestricted range of motion observed. Neurologic: Awake, alert, and oriented x 4.  Skin: Excoriated areas on right lateral chest wall, right arm and legs. Maculopapular, erythematous areas diffuse over skin from neck to feet.  ____________________________________________   LABS (all labs ordered are listed, but only abnormal results are displayed)  Labs Reviewed - No data to display ____________________________________________  EKG  Not indicated. ____________________________________________  RADIOLOGY  Not indicated. ____________________________________________   PROCEDURES  Procedures ____________________________________________   INITIAL IMPRESSION / ASSESSMENT AND PLAN / ED COURSE  Juan Best is a 39 y.o. male presenting to the emergency department for treatment and evaluation of rash.  See HPI for further details.  Rash is excoriated and difficult to assess.  Patient feels that it is poison ivy.  I do see a few scattered vesicles but  mainly the rash or erythematous.  He will be treated with Atarax and prednisone.  He is to follow-up with primary care or return to the emergency department for symptoms change or worsen or for new concerns.   Medications - No data to display   Pertinent labs & imaging results that were available during my care of the patient were reviewed by me and considered in my medical decision making (see chart for details).  ____________________________________________   FINAL CLINICAL  IMPRESSION(S) / ED DIAGNOSES  Final diagnoses:  None    ED Discharge Orders    None       Note:  This document was prepared using Dragon voice recognition software and may include unintentional dictation errors.   Chinita Pester, FNP 02/20/20 1507    Minna Antis, MD 02/20/20 1536

## 2020-02-20 NOTE — Discharge Instructions (Signed)
Take the medication as prescribed and until finished.  See primary care or return to the ER for symptoms of concern.

## 2020-02-20 NOTE — ED Triage Notes (Signed)
Pt c/o rash all over , states "I dont know if it poison ivy or what".

## 2020-05-29 ENCOUNTER — Emergency Department: Payer: Self-pay

## 2020-05-29 ENCOUNTER — Encounter: Payer: Self-pay | Admitting: Emergency Medicine

## 2020-05-29 ENCOUNTER — Other Ambulatory Visit: Payer: Self-pay

## 2020-05-29 ENCOUNTER — Emergency Department
Admission: EM | Admit: 2020-05-29 | Discharge: 2020-05-29 | Disposition: A | Discharge status: Home / Self Care | Payer: Self-pay | Attending: Emergency Medicine | Admitting: Emergency Medicine

## 2020-05-29 DIAGNOSIS — F17210 Nicotine dependence, cigarettes, uncomplicated: Secondary | ICD-10-CM | POA: Insufficient documentation

## 2020-05-29 DIAGNOSIS — M25511 Pain in right shoulder: Secondary | ICD-10-CM | POA: Insufficient documentation

## 2020-05-29 MED ORDER — MELOXICAM 15 MG PO TABS
15.0000 mg | ORAL_TABLET | Freq: Every day | ORAL | 0 refills | Status: AC
Start: 2020-05-29 — End: 2020-06-28

## 2020-05-29 NOTE — ED Provider Notes (Signed)
[facility]-EMERGENCY DEPARTMENT  ____________________________________________  Time seen: Approximately 10:30 PM  I have reviewed the triage vital signs and the nursing notes.   HISTORY  Chief Complaint Shoulder Pain   Historian Patient     HPI Juan Best is a 40 y.o. male presents to the emergency department with acute on chronic right shoulder pain for the past 3 weeks.  Patient localizes pain over the right acromion.  No numbness or tingling in the right upper extremity.  No new falls or mechanisms of trauma.  Patient states that he is recently started a new job and does not yet have insurance.  He has been under the care of Dr. Joice Lofts in the past who has recommended an MRI.  Patient states that he is waiting for insurance coverage to obtain MRI.   Past Medical History:  Diagnosis Date  . Arthritis   . GERD (gastroesophageal reflux disease)      Immunizations up to date:  Yes.     Past Medical History:  Diagnosis Date  . Arthritis   . GERD (gastroesophageal reflux disease)     There are no problems to display for this patient.   Past Surgical History:  Procedure Laterality Date  . ANKLE SURGERY      Prior to Admission medications   Medication Sig Start Date End Date Taking? Authorizing Provider  hydrOXYzine (ATARAX/VISTARIL) 25 MG tablet Take 1 tablet (25 mg total) by mouth 3 (three) times daily as needed for anxiety. 02/20/20   Triplett, Rulon Eisenmenger B, FNP  meloxicam (MOBIC) 15 MG tablet Take 1 tablet (15 mg total) by mouth daily. 05/29/20 06/28/20  Orvil Feil, PA-C  predniSONE (STERAPRED UNI-PAK 21 TAB) 10 MG (21) TBPK tablet Take 6 tablets on the first day and decrease by 1 tablet each day until finished. 02/20/20   Triplett, Rulon Eisenmenger B, FNP    Allergies Sulfa antibiotics  Family History  Problem Relation Age of Onset  . Healthy Mother   . Healthy Father     Social History Social History   Tobacco Use  . Smoking status: Current Every Day Smoker     Packs/day: 0.50    Types: Cigarettes  . Smokeless tobacco: Never Used  Vaping Use  . Vaping Use: Never used  Substance Use Topics  . Alcohol use: Yes  . Drug use: No     Review of Systems  Constitutional: No fever/chills Eyes:  No discharge ENT: No upper respiratory complaints. Respiratory: no cough. No SOB/ use of accessory muscles to breath Gastrointestinal:   No nausea, no vomiting.  No diarrhea.  No constipation. Musculoskeletal: Patient has right shoulder pain.  Skin: Negative for rash, abrasions, lacerations, ecchymosis. ____________________________________________   PHYSICAL EXAM:  VITAL SIGNS: ED Triage Vitals  Enc Vitals Group     BP 05/29/20 1913 115/80     Pulse Rate 05/29/20 1913 65     Resp 05/29/20 1913 18     Temp 05/29/20 1913 98.6 F (37 C)     Temp Source 05/29/20 1913 Oral     SpO2 05/29/20 1913 97 %     Weight 05/29/20 1842 134 lb 14.7 oz (61.2 kg)     Height 05/29/20 1842 5\' 9"  (1.753 m)     Head Circumference --      Peak Flow --      Pain Score 05/29/20 1841 10     Pain Loc --      Pain Edu? --      Excl. in  GC? --      Constitutional: Alert and oriented. Well appearing and in no acute distress. Eyes: Conjunctivae are normal. PERRL. EOMI. Head: Atraumatic. Cardiovascular: Normal rate, regular rhythm. Normal S1 and S2.  Good peripheral circulation. Respiratory: Normal respiratory effort without tachypnea or retractions. Lungs CTAB. Good air entry to the bases with no decreased or absent breath sounds Gastrointestinal: Bowel sounds x 4 quadrants. Soft and nontender to palpation. No guarding or rigidity. No distention. Musculoskeletal: Full range of motion to all extremities. No obvious deformities noted.  No weakness with the right rotator cuff testing.  Patient does have some pain however.  Palpable radial pulse, right. Neurologic:  Normal for age. No gross focal neurologic deficits are appreciated.  Skin:  Skin is warm, dry and intact. No  rash noted. Psychiatric: Mood and affect are normal for age. Speech and behavior are normal.   ____________________________________________   LABS (all labs ordered are listed, but only abnormal results are displayed)  Labs Reviewed - No data to display ____________________________________________  EKG   ____________________________________________  RADIOLOGY Geraldo Pitter, personally viewed and evaluated these images (plain radiographs) as part of my medical decision making, as well as reviewing the written report by the radiologist.  DG Shoulder Right  Result Date: 05/29/2020 CLINICAL DATA:  Right shoulder pain for several weeks following heavy lifting, initial encounter EXAM: RIGHT SHOULDER - 2+ VIEW COMPARISON:  None. FINDINGS: There is no evidence of fracture or dislocation. There is no evidence of arthropathy or other focal bone abnormality. Soft tissues are unremarkable. Right fifth rib is bifid in nature. IMPRESSION: No acute abnormality noted. Electronically Signed   By: Alcide Clever M.D.   On: 05/29/2020 21:27    ____________________________________________    PROCEDURES  Procedure(s) performed:     Procedures     Medications - No data to display   ____________________________________________   INITIAL IMPRESSION / ASSESSMENT AND PLAN / ED COURSE  Pertinent labs & imaging results that were available during my care of the patient were reviewed by me and considered in my medical decision making (see chart for details).      Assessment and plan Shoulder pain 40 year old male presents to the emergency department with acute right shoulder pain.  Vital signs are reassuring at triage.  On physical exam, patient was alert, active and nontoxic-appearing with no rotator cuff weakness.  X-ray of the right shoulder shows no acute bony abnormality.  Advised patient to take meloxicam daily for pain and inflammation and to apply ice.  Recommended following up  with orthopedics once patient has secured insurance for nonemergent MRI.  All patient questions were answered.     ____________________________________________  FINAL CLINICAL IMPRESSION(S) / ED DIAGNOSES  Final diagnoses:  Acute pain of right shoulder      NEW MEDICATIONS STARTED DURING THIS VISIT:  ED Discharge Orders         Ordered    meloxicam (MOBIC) 15 MG tablet  Daily        05/29/20 2228              This chart was dictated using voice recognition software/Dragon. Despite best efforts to proofread, errors can occur which can change the meaning. Any change was purely unintentional.     Orvil Feil, PA-C 05/29/20 2234    Minna Antis, MD 05/29/20 [phone]

## 2020-05-29 NOTE — Discharge Instructions (Signed)
Take Meloxicam once daily.  Apply ice to right shoulder.

## 2020-05-29 NOTE — ED Triage Notes (Signed)
C/O right shoulder pain x 3 weeks.  States may have injured it lifting something.  FUll ROM seen  Skin warm and dry. NAD

## 2020-07-19 ENCOUNTER — Ambulatory Visit
Admission: EM | Admit: 2020-07-19 | Discharge: 2020-07-19 | Discharge status: Against Medical Advice | Payer: Self-pay | Attending: Sports Medicine | Admitting: Sports Medicine

## 2020-07-19 ENCOUNTER — Other Ambulatory Visit: Payer: Self-pay

## 2020-07-20 ENCOUNTER — Other Ambulatory Visit: Payer: Self-pay

## 2020-07-20 ENCOUNTER — Encounter: Payer: Self-pay | Admitting: Emergency Medicine

## 2020-07-20 ENCOUNTER — Emergency Department
Admission: EM | Admit: 2020-07-20 | Discharge: 2020-07-20 | Disposition: A | Discharge status: Home / Self Care | Payer: Self-pay | Attending: Emergency Medicine | Admitting: Emergency Medicine

## 2020-07-20 ENCOUNTER — Emergency Department: Payer: Self-pay

## 2020-07-20 DIAGNOSIS — F17210 Nicotine dependence, cigarettes, uncomplicated: Secondary | ICD-10-CM | POA: Insufficient documentation

## 2020-07-20 DIAGNOSIS — J069 Acute upper respiratory infection, unspecified: Secondary | ICD-10-CM | POA: Insufficient documentation

## 2020-07-20 DIAGNOSIS — Z20822 Contact with and (suspected) exposure to covid-19: Secondary | ICD-10-CM | POA: Insufficient documentation

## 2020-07-20 DIAGNOSIS — R197 Diarrhea, unspecified: Secondary | ICD-10-CM | POA: Insufficient documentation

## 2020-07-20 LAB — RESP PANEL BY RT-PCR (FLU A&B, COVID) ARPGX2
Influenza A by PCR: NEGATIVE
Influenza B by PCR: NEGATIVE
SARS Coronavirus 2 by RT PCR: NEGATIVE

## 2020-07-20 MED ORDER — GUAIFENESIN-CODEINE 100-10 MG/5ML PO SOLN: 5.0000 mL | ORAL | 0 refills | Status: DC | PRN

## 2020-07-20 NOTE — Discharge Instructions (Signed)
Follow-up with your primary care provider or [facility] acute care if any continued problems.  Increase fluids.  Tylenol or ibuprofen as needed for body aches, throat pain or headache.  Take guaifenesin with codeine every 4 hours as needed for cough and congestion.  Return to the emergency department for any severe worsening of your symptoms or urgent concerns.  Your COVID and influenza test was negative.

## 2020-07-20 NOTE — ED Triage Notes (Signed)
C/O painful cough, sinus congestion and sore throat x 3 days.  AAOx3.  Skin warm and dry. NAD

## 2020-07-20 NOTE — ED Provider Notes (Signed)
[facility] Emergency Department Provider Note  ____________________________________________   Event Date/Time   First MD Initiated Contact with Patient 07/20/20 [phone]     (approximate)  I have reviewed the triage vital signs and the nursing notes.   HISTORY  Chief Complaint URI   HPI Juan Best is a 40 y.o. male  to the ED with complaint of painful cough, sinus congestion and sore throat for the last 3 days.  Patient is unaware of any actual known fever but reports that he has had chills at home.  Patient also states that he has had some diarrhea but no vomiting.  He is unaware of any known Covid exposure.  Has taken some over-the-counter medication without any relief.  Currently he denies any pain.       Past Medical History:  Diagnosis Date  . Arthritis   . GERD (gastroesophageal reflux disease)     There are no problems to display for this patient.   Past Surgical History:  Procedure Laterality Date  . ANKLE SURGERY      Prior to Admission medications   Medication Sig Start Date End Date Taking? Authorizing Provider  guaiFENesin-codeine 100-10 MG/5ML syrup Take 5 mLs by mouth every 4 (four) hours as needed. 07/20/20   Tommi Rumps, PA-C  hydrOXYzine (ATARAX/VISTARIL) 25 MG tablet Take 1 tablet (25 mg total) by mouth 3 (three) times daily as needed for anxiety. 02/20/20   Triplett, Cari B, FNP  predniSONE (STERAPRED UNI-PAK 21 TAB) 10 MG (21) TBPK tablet Take 6 tablets on the first day and decrease by 1 tablet each day until finished. 02/20/20   Triplett, Rulon Eisenmenger B, FNP    Allergies Sulfa antibiotics  Family History  Problem Relation Age of Onset  . Healthy Mother   . Healthy Father     Social History Social History   Tobacco Use  . Smoking status: Current Every Day Smoker    Packs/day: 0.50    Types: Cigarettes  . Smokeless tobacco: Never Used  Vaping Use  . Vaping Use: Never used  Substance Use Topics  . Alcohol  use: Yes  . Drug use: No    Review of Systems Constitutional: No fever/objective chills Eyes: No visual changes. ENT: Positive sore throat.  Positive sinus congestion. Cardiovascular: Denies chest pain. Respiratory: Denies shortness of breath.  Positive for cough. Gastrointestinal: No abdominal pain.  No nausea, no vomiting.  Positive diarrhea.   Genitourinary: Negative for dysuria. Musculoskeletal: Positive for muscle aches. Skin: Negative for rash. Neurological: Negative for headaches, focal weakness or numbness. ____________________________________________   PHYSICAL EXAM:  VITAL SIGNS: ED Triage Vitals  Enc Vitals Group     BP 07/20/20 0959 115/79     Pulse Rate 07/20/20 0959 (!) 103     Resp 07/20/20 0959 18     Temp 07/20/20 0959 98.8 F (37.1 C)     Temp Source 07/20/20 0959 Oral     SpO2 07/20/20 0959 98 %     Weight 07/20/20 0945 134 lb 7.7 oz (61 kg)     Height 07/20/20 0945 5\' 9"  (1.753 m)     Head Circumference --      Peak Flow --      Pain Score 07/20/20 0945 0     Pain Loc --      Pain Edu? --      Excl. in GC? --     Constitutional: Alert and oriented. Well appearing and in  no acute distress. Eyes: Conjunctivae are normal. PERRL. EOMI. Head: Atraumatic. Nose: No congestion/rhinnorhea. Mouth/Throat: Mucous membranes are moist.  Oropharynx non-erythematous. Neck: No stridor.   Cardiovascular: Normal rate, regular rhythm. Grossly normal heart sounds.  Good peripheral circulation. Respiratory: Normal respiratory effort.  No retractions. Lungs CTAB. Gastrointestinal: Soft and nontender. No distention.  Musculoskeletal: Moves upper and lower extremities with any difficulty.  Normal gait was noted. Neurologic:  Normal speech and language. No gross focal neurologic deficits are appreciated. No gait instability. Skin:  Skin is warm, dry and intact. No rash noted. Psychiatric: Mood and affect are normal. Speech and behavior are  normal.  ____________________________________________   LABS (all labs ordered are listed, but only abnormal results are displayed)  Labs Reviewed  RESP PANEL BY RT-PCR (FLU A&B, COVID) [id number]    RADIOLOGY I, Tommi Rumps, personally viewed and evaluated these images (plain radiographs) as part of my medical decision making, as well as reviewing the written report by the radiologist.   Official radiology report(s): DG Chest Port 1 View  Result Date: 07/20/2020 CLINICAL DATA:  Cough and chills. EXAM: PORTABLE CHEST 1 VIEW COMPARISON:  12/02/2016 FINDINGS: Cardiac and mediastinal contours normal. Negative for heart failure. Negative for pneumonia or effusion Chronic multiple right rib fractures unchanged. IMPRESSION: No acute cardiopulmonary abnormality. Electronically Signed   By: Marlan Palau M.D.   On: 07/20/2020 10:51    ____________________________________________   PROCEDURES  Procedure(s) performed (including Critical Care):  Procedures   ____________________________________________   INITIAL IMPRESSION / ASSESSMENT AND PLAN / ED COURSE  As part of my medical decision making, I reviewed the following data within the electronic MEDICAL RECORD NUMBER Notes from prior ED visits and [place] Controlled Substance Database  40 year old male presents to the ED with complaint of cough, sinus congestion and sore throat for the last 3 days.  Patient states that he has had a subjective chills but denies actual fever.  He also has had some diarrhea.  He is unaware of any known Covid exposure.  Patient was made aware that his Covid and influenza test were negative.  His chest x-ray did not show any bronchitis or pneumonia and he was reassured.  Patient was given a prescription for Robitussin-AC and he is is aware that this contains a narcotic and he should not drive or operate machinery while taking this medication.  He is to increase fluids and ibuprofen or Tylenol if needed for throat  pain, fever, headache.  He is to follow-up with his PCP if any continued problems or concerns. ____________________________________________   FINAL CLINICAL IMPRESSION(S) / ED DIAGNOSES  Final diagnoses:  Viral URI with cough     ED Discharge Orders         Ordered    guaiFENesin-codeine 100-10 MG/5ML syrup  Every 4 hours PRN,   Status:  Discontinued        07/20/20 1122    guaiFENesin-codeine 100-10 MG/5ML syrup  Every 4 hours PRN        07/20/20 1124          *Please note:  JOCOB DAMBACH was evaluated in Emergency Department on 07/20/2020 for the symptoms described in the history of present illness. He was evaluated in the context of the global COVID-19 pandemic, which necessitated consideration that the patient might be at risk for infection with the SARS-CoV-2 virus that causes COVID-19. Institutional protocols and algorithms that pertain to the evaluation of patients at risk for COVID-19 are in a state of rapid  change based on information released by regulatory bodies including the CDC and federal and state organizations. These policies and algorithms were followed during the patient's care in the ED.  Some ED evaluations and interventions may be delayed as a result of limited staffing during and the pandemic.*   Note:  This document was prepared using Dragon voice recognition software and may include unintentional dictation errors.    Tommi Rumps, PA-C 07/20/20 1509    Concha Se, MD 07/21/20 [phone]

## 2020-07-24 ENCOUNTER — Other Ambulatory Visit: Payer: Self-pay

## 2020-07-24 ENCOUNTER — Emergency Department: Payer: Self-pay

## 2020-07-24 ENCOUNTER — Encounter: Payer: Self-pay | Admitting: Emergency Medicine

## 2020-07-24 ENCOUNTER — Emergency Department
Admission: EM | Admit: 2020-07-24 | Discharge: 2020-07-24 | Disposition: A | Discharge status: Home / Self Care | Payer: Self-pay | Attending: Emergency Medicine | Admitting: Emergency Medicine

## 2020-07-24 DIAGNOSIS — F17210 Nicotine dependence, cigarettes, uncomplicated: Secondary | ICD-10-CM | POA: Insufficient documentation

## 2020-07-24 DIAGNOSIS — J449 Chronic obstructive pulmonary disease, unspecified: Secondary | ICD-10-CM | POA: Insufficient documentation

## 2020-07-24 DIAGNOSIS — J069 Acute upper respiratory infection, unspecified: Secondary | ICD-10-CM | POA: Insufficient documentation

## 2020-07-24 LAB — COMPREHENSIVE METABOLIC PANEL
ALT: 12 U/L (ref 0–44)
AST: 20 U/L (ref 15–41)
Albumin: 4.1 g/dL (ref 3.5–5.0)
Alkaline Phosphatase: 74 U/L (ref 38–126)
Anion gap: 10 (ref 5–15)
BUN: 10 mg/dL (ref 6–20)
CO2: 28 mmol/L (ref 22–32)
Calcium: 9.3 mg/dL (ref 8.9–10.3)
Chloride: 96 mmol/L — ABNORMAL LOW (ref 98–111)
Creatinine, Ser: 1.14 mg/dL (ref 0.61–1.24)
GFR, Estimated: >60 mL/min (ref >60)
Glucose, Bld: 97 mg/dL (ref 70–99)
Potassium: 4.6 mmol/L (ref 3.5–5.1)
Sodium: 134 mmol/L — ABNORMAL LOW (ref 135–145)
Total Bilirubin: 0.7 mg/dL (ref 0.3–1.2)
Total Protein: 8 g/dL (ref 6.5–8.1)

## 2020-07-24 LAB — CBC WITH DIFFERENTIAL/PLATELET
Abs Immature Granulocytes: 0.02 10*3/uL (ref 0.00–0.07)
Basophils Absolute: 0 10*3/uL (ref 0.0–0.1)
Basophils Relative: 0 %
Eosinophils Absolute: 0.1 10*3/uL (ref 0.0–0.5)
Eosinophils Relative: 1 %
HCT: 44.4 % (ref 39.0–52.0)
Hemoglobin: 14 g/dL (ref 13.0–17.0)
Immature Granulocytes: 0 %
Lymphocytes Relative: 12 %
Lymphs Abs: 1 10*3/uL (ref 0.7–4.0)
MCH: 27.7 pg (ref 26.0–34.0)
MCHC: 31.5 g/dL (ref 30.0–36.0)
MCV: 87.7 fL (ref 80.0–100.0)
Monocytes Absolute: 0.6 10*3/uL (ref 0.1–1.0)
Monocytes Relative: 7 %
Neutro Abs: 6.8 10*3/uL (ref 1.7–7.7)
Neutrophils Relative %: 80 %
Platelets: 254 10*3/uL (ref 150–400)
RBC: 5.06 MIL/uL (ref 4.22–5.81)
RDW: 13.5 % (ref 11.5–15.5)
WBC: 8.6 10*3/uL (ref 4.0–10.5)
nRBC: 0 % (ref 0.0–0.2)

## 2020-07-24 LAB — LACTIC ACID, PLASMA: Lactic Acid, Venous: 0.8 mmol/L (ref 0.5–1.9)

## 2020-07-24 MED ORDER — PREDNISONE 20 MG PO TABS
60.0000 mg | ORAL_TABLET | Freq: Once | ORAL | Status: AC
  Administered 2020-07-24: 60 mg via ORAL
  Filled 2020-07-24: qty 3

## 2020-07-24 MED ORDER — IPRATROPIUM-ALBUTEROL 0.5-2.5 (3) MG/3ML IN SOLN
3.0000 mL | Freq: Once | RESPIRATORY_TRACT | Status: AC
  Administered 2020-07-24: 3 mL via RESPIRATORY_TRACT
  Filled 2020-07-24: qty 3

## 2020-07-24 MED ORDER — SODIUM CHLORIDE 0.9 % IV BOLUS
1000.0000 mL | Freq: Once | INTRAVENOUS | Status: AC
  Administered 2020-07-24: 1000 mL via INTRAVENOUS

## 2020-07-24 MED ORDER — PREDNISONE 10 MG PO TABS
ORAL_TABLET | ORAL | 0 refills | Status: DC
Start: 2020-07-24 — End: 2021-01-03

## 2020-07-24 MED ORDER — HYDROCOD POLST-CPM POLST ER 10-8 MG/5ML PO SUER: 5.0000 mL | Freq: Two times a day (BID) | ORAL | 0 refills | Status: DC | PRN

## 2020-07-24 MED ORDER — ALBUTEROL SULFATE HFA 108 (90 BASE) MCG/ACT IN AERS
2.0000 | INHALATION_SPRAY | Freq: Four times a day (QID) | RESPIRATORY_TRACT | 2 refills | Status: AC | PRN
Start: 2020-07-24 — End: ?

## 2020-07-24 NOTE — ED Notes (Signed)
See triage note  Presents with nasal congestion slight cough and chest pressure    Sx's started couple of days ago  Low grade temp on arrival

## 2020-07-24 NOTE — Discharge Instructions (Signed)
Follow-up with [facility] acute care if any continued problems or concerns.  A prescription for prednisone was sent to the pharmacy which she will start tomorrow beginning with 5 tablets and tapering down over the next several days as prescribed on the label.  Also an albuterol inhaler is 2 puffs every 6 hours or as needed for cough, wheezing or shortness of breath.  Discontinue taking the codeine and guaifenesin cough medication.  A prescription for Tussionex was sent to the pharmacy which is stronger.  This medicine is only once every 12 hours.  This can cause drowsiness and increase your risk for injury so do not drive while taking this medication.  It may be necessary for you to follow-up with a pulmonologist if you continue to have problems with your lungs.  [facility] also has a pulmonologist and their clinic.

## 2020-07-24 NOTE — ED Provider Notes (Signed)
[facility] Emergency Department Provider Note   ____________________________________________   Event Date/Time   First MD Initiated Contact with Patient 07/24/20 1159     (approximate)  I have reviewed the triage vital signs and the nursing notes.   HISTORY  Chief Complaint Nasal Congestion and Generalized Body Aches   HPI Juan Best is a 40 y.o. male presents to the ED with complaint of nasal congestion, cough and chest pressure.  Patient also reports chills, fever, body aches that have been going on for approximately 2 weeks.  Patient denies any vomiting or diarrhea.  He again denies any change in taste or smell.  He was seen in the emergency department approximately 4 days ago which time his Covid test was negative.  Patient has been taking and Robitussin-AC without any relief of his cough.  Patient is a current 1/2 pack a day smoker but states he has decreased the amount of smoking since being sick.  He rates pain as 9/10.      Past Medical History:  Diagnosis Date  . Arthritis   . GERD (gastroesophageal reflux disease)     There are no problems to display for this patient.   Past Surgical History:  Procedure Laterality Date  . ANKLE SURGERY      Prior to Admission medications   Medication Sig Start Date End Date Taking? Authorizing Provider  albuterol (VENTOLIN HFA) 108 (90 Base) MCG/ACT inhaler Inhale 2 puffs into the lungs every 6 (six) hours as needed for wheezing or shortness of breath. 07/24/20  Yes Bridget Hartshorn L, PA-C  chlorpheniramine-HYDROcodone (TUSSIONEX PENNKINETIC ER) 10-8 MG/5ML SUER Take 5 mLs by mouth every 12 (twelve) hours as needed for cough. 07/24/20  Yes Bridget Hartshorn L, PA-C  predniSONE (DELTASONE) 10 MG tablet Take 5 tablets tomorrow, on day 2 take 4 tablets, day 3 take 3 tablets, day 4 take 2 tablets, day 5 take 1 tablets. 07/24/20  Yes Tommi Rumps, PA-C    Allergies Sulfa antibiotics  Family History   Problem Relation Age of Onset  . Healthy Mother   . Healthy Father     Social History Social History   Tobacco Use  . Smoking status: Current Every Day Smoker    Packs/day: 0.50    Types: Cigarettes  . Smokeless tobacco: Never Used  Vaping Use  . Vaping Use: Never used  Substance Use Topics  . Alcohol use: Yes  . Drug use: No    Review of Systems Constitutional: Positive fever/chills Eyes: No visual changes. ENT: Positive sore throat from coughing.  Positive nasal congestion. Cardiovascular: Denies chest pain. Respiratory: Denies shortness of breath.  Positive cough. Gastrointestinal: No abdominal pain.  No nausea, no vomiting.  No diarrhea.  Genitourinary: Negative for dysuria. Musculoskeletal: Negative for musculoskeletal pain. Skin: Negative for rash. Neurological: Negative for headaches, focal weakness or numbness.  ____________________________________________   PHYSICAL EXAM:  VITAL SIGNS: ED Triage Vitals  Enc Vitals Group     BP 07/24/20 1118 104/81     Pulse Rate 07/24/20 1118 93     Resp 07/24/20 1118 18     Temp 07/24/20 1118 99.8 F (37.7 C)     Temp Source 07/24/20 1118 Oral     SpO2 07/24/20 1118 99 %     Weight 07/24/20 1121 135 lb (61.2 kg)     Height 07/24/20 1121 5\' 9"  (1.753 m)     Head Circumference --      Peak Flow --  Pain Score 07/24/20 1121 9     Pain Loc --      Pain Edu? --      Excl. in GC? --     Constitutional: Alert and oriented. Well appearing and in no acute distress. Eyes: Conjunctivae are normal. PERRL. EOMI. Head: Atraumatic. Nose: Mild congestion/rhinnorhea. Neck: No stridor.   Cardiovascular: Normal rate, regular rhythm. Grossly normal heart sounds.  Good peripheral circulation. Respiratory: Normal respiratory effort.  No retractions. Lungs CTAB.  Coarse cough noted. Gastrointestinal: Soft and nontender. No distention.  Musculoskeletal: Moves upper and lower extremities no difficulty.  Normal gait was  noted. Neurologic:  Normal speech and language. No gross focal neurologic deficits are appreciated. No gait instability. Skin:  Skin is warm, dry and intact. No rash noted. Psychiatric: Mood and affect are normal. Speech and behavior are normal.  ____________________________________________   LABS (all labs ordered are listed, but only abnormal results are displayed)  Labs Reviewed  COMPREHENSIVE METABOLIC PANEL - Abnormal; Notable for the following components:      Result Value   Sodium 134 (*)    Chloride 96 (*)    All other components within normal limits  CBC WITH DIFFERENTIAL/PLATELET  LACTIC ACID, PLASMA   ____________________________________________  EKG Reviewed by physician on major side of the ED. Sinus rhythm with a short PR interval.  Ventricular rate 83. ____________________________________________  RADIOLOGY I, Tommi Rumps, personally viewed and evaluated these images (plain radiographs) as part of my medical decision making, as well as reviewing the written report by the radiologist.   Official radiology report(s): DG Chest 2 View  Result Date: 07/24/2020 CLINICAL DATA:  Productive cough, fever, and chills, negative COVID-19 test 4 days ago, smoker EXAM: CHEST - 2 VIEW COMPARISON:  07/20/2020 FINDINGS: Normal heart size, mediastinal contours, and pulmonary vascularity. Mild peribronchial thickening. Lungs hyperinflated but clear. No acute infiltrate, pleural effusion, or pneumothorax. Osseous structures unremarkable. IMPRESSION: Hyperinflated lungs and mild peribronchial thickening which could reflect COPD or asthma. No acute infiltrate. Electronically Signed   By: Ulyses Southward M.D.   On: 07/24/2020 13:01    ____________________________________________   PROCEDURES  Procedure(s) performed (including Critical Care):  Procedures   ____________________________________________   INITIAL IMPRESSION / ASSESSMENT AND PLAN / ED COURSE  As part of my  medical decision making, I reviewed the following data within the electronic MEDICAL RECORD NUMBER Notes from prior ED visits and [place] Controlled Substance Database  40 year old male presents to the ED with continued cough, congestion, fever, chills.  He was seen in the emergency department on 07/20/2020 which time his Covid test was negative.  Patient states that the Robitussin-AC is not helped with his cough.  Lab work was reassuring.  Chest x-ray shows peribronchial thickening and hyperinflation suggestive of either asthma or COPD.  Patient was given prednisone 60 mg p.o. and a DuoNeb treatment while in the ED.  We discussed continuing with a albuterol inhaler at home 2 puffs every 6 hours, continuing a tapering dose of prednisone and a prescription for Tussionex was sent to the pharmacy.  Patient is encouraged to follow-up with [facility] acute care and also a pulmonologist at [facility] if he does not already have a primary care provider.  Strong encouragement for discontinuing smoking cigarettes.  ____________________________________________   FINAL CLINICAL IMPRESSION(S) / ED DIAGNOSES  Final diagnoses:  URI with cough and congestion  Chronic obstructive pulmonary disease, unspecified COPD type [facility])     ED Discharge Orders  Ordered    predniSONE (DELTASONE) 10 MG tablet        07/24/20 1408    albuterol (VENTOLIN HFA) 108 (90 Base) MCG/ACT inhaler  Every 6 hours PRN        07/24/20 1408    chlorpheniramine-HYDROcodone (TUSSIONEX PENNKINETIC ER) 10-8 MG/5ML SUER  Every 12 hours PRN        07/24/20 1408          *Please note:  Juan Best was evaluated in Emergency Department on 07/24/2020 for the symptoms described in the history of present illness. He was evaluated in the context of the global COVID-19 pandemic, which necessitated consideration that the patient might be at risk for infection with the SARS-CoV-2 virus that causes COVID-19. Institutional protocols and  algorithms that pertain to the evaluation of patients at risk for COVID-19 are in a state of rapid change based on information released by regulatory bodies including the CDC and federal and state organizations. These policies and algorithms were followed during the patient's care in the ED.  Some ED evaluations and interventions may be delayed as a result of limited staffing during and the pandemic.*   Note:  This document was prepared using Dragon voice recognition software and may include unintentional dictation errors.    Tommi Rumps, PA-C 07/24/20 1443    Chesley Noon, MD 07/24/20 [phone]

## 2020-07-24 NOTE — ED Triage Notes (Signed)
Patient presents to the ED with chills, body aches, cough and congestion x 2 weeks.  Patient was seen in the ED a few days ago and given a prescription for cough medication that patient states has not improved his symptoms.  Patient denies any vomiting or diarrhea in the past 24 hours but reports occasional issues with those in the past few weeks.

## 2020-11-30 IMAGING — CR DG SHOULDER 2+V*R*
1 series · 3 of 3 positions shown · non-contrast
Comparison: 11/03/2017

CLINICAL DATA: Chronic right shoulder pain.

EXAM:
RIGHT SHOULDER - 2+ VIEW

[Series 1: dg shoulder right · 0.14mm/px · 3 of 3 slices shown]
[im 1/3]
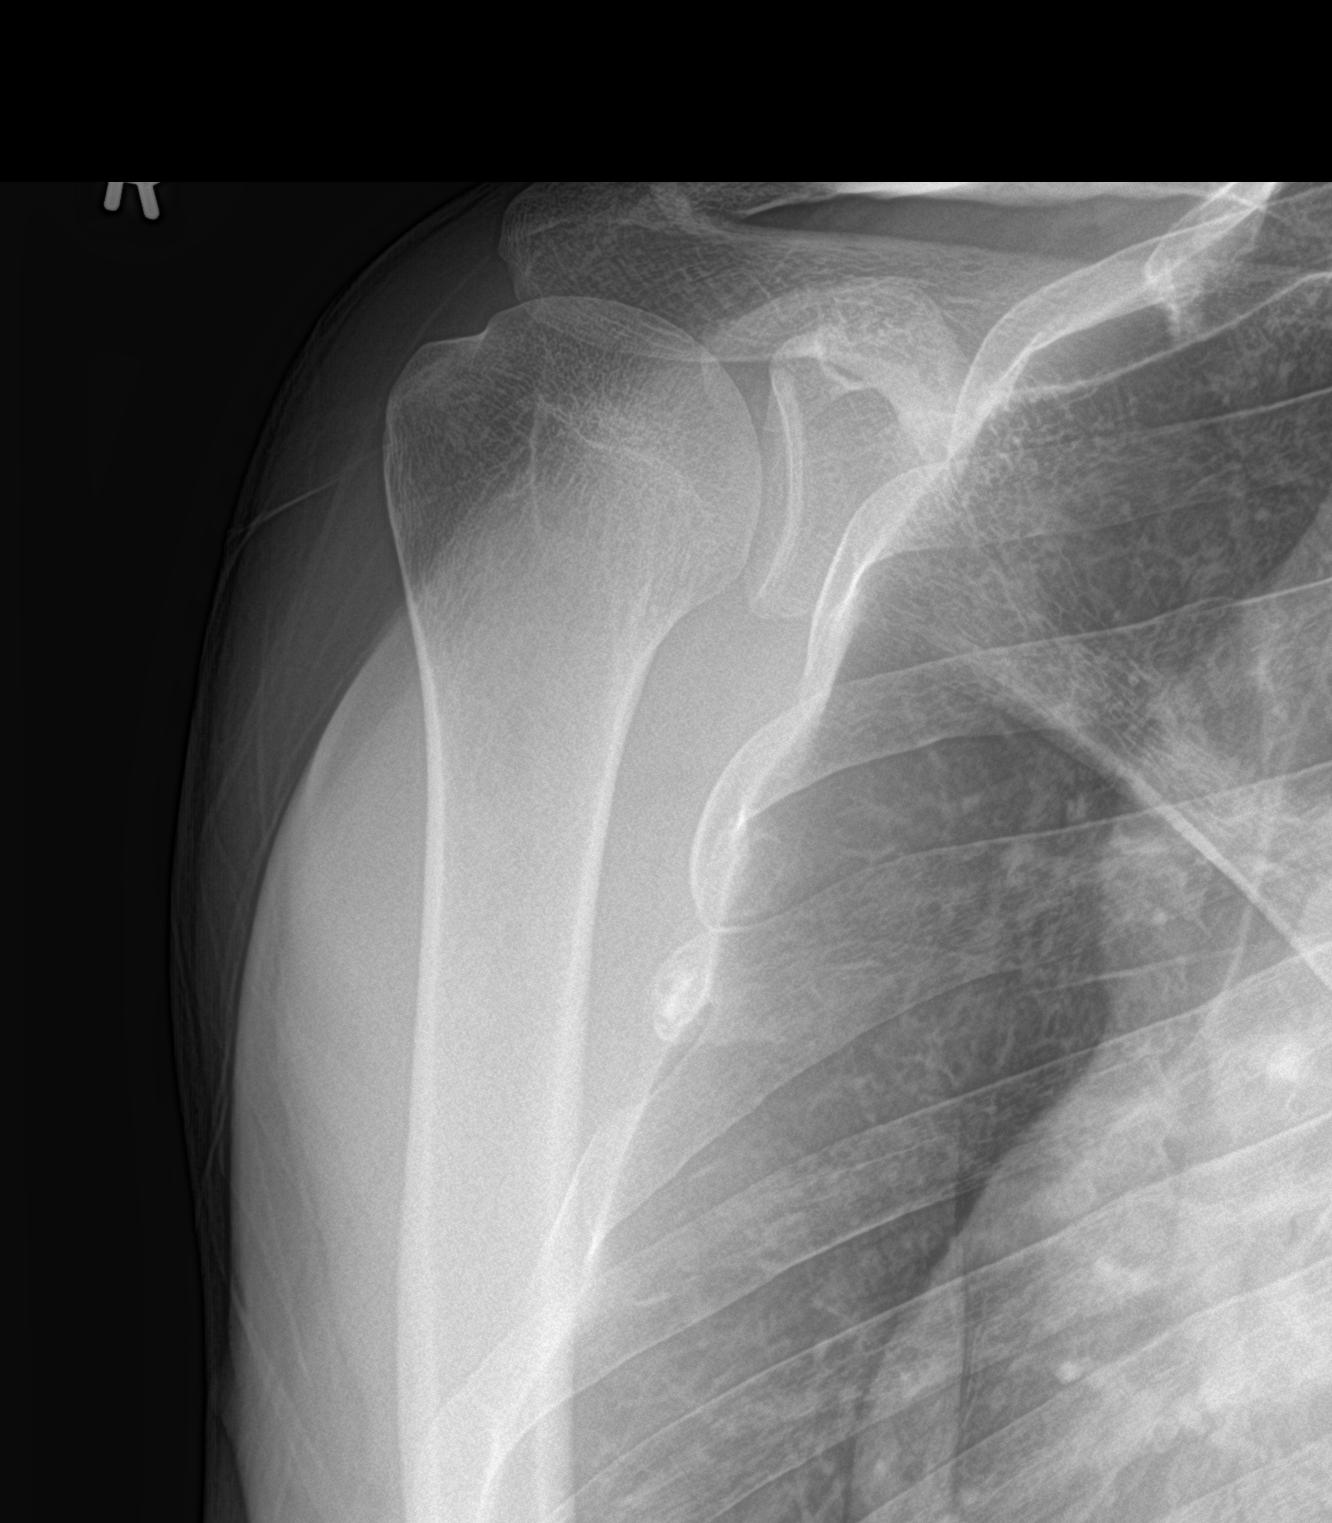
[im 2/3]
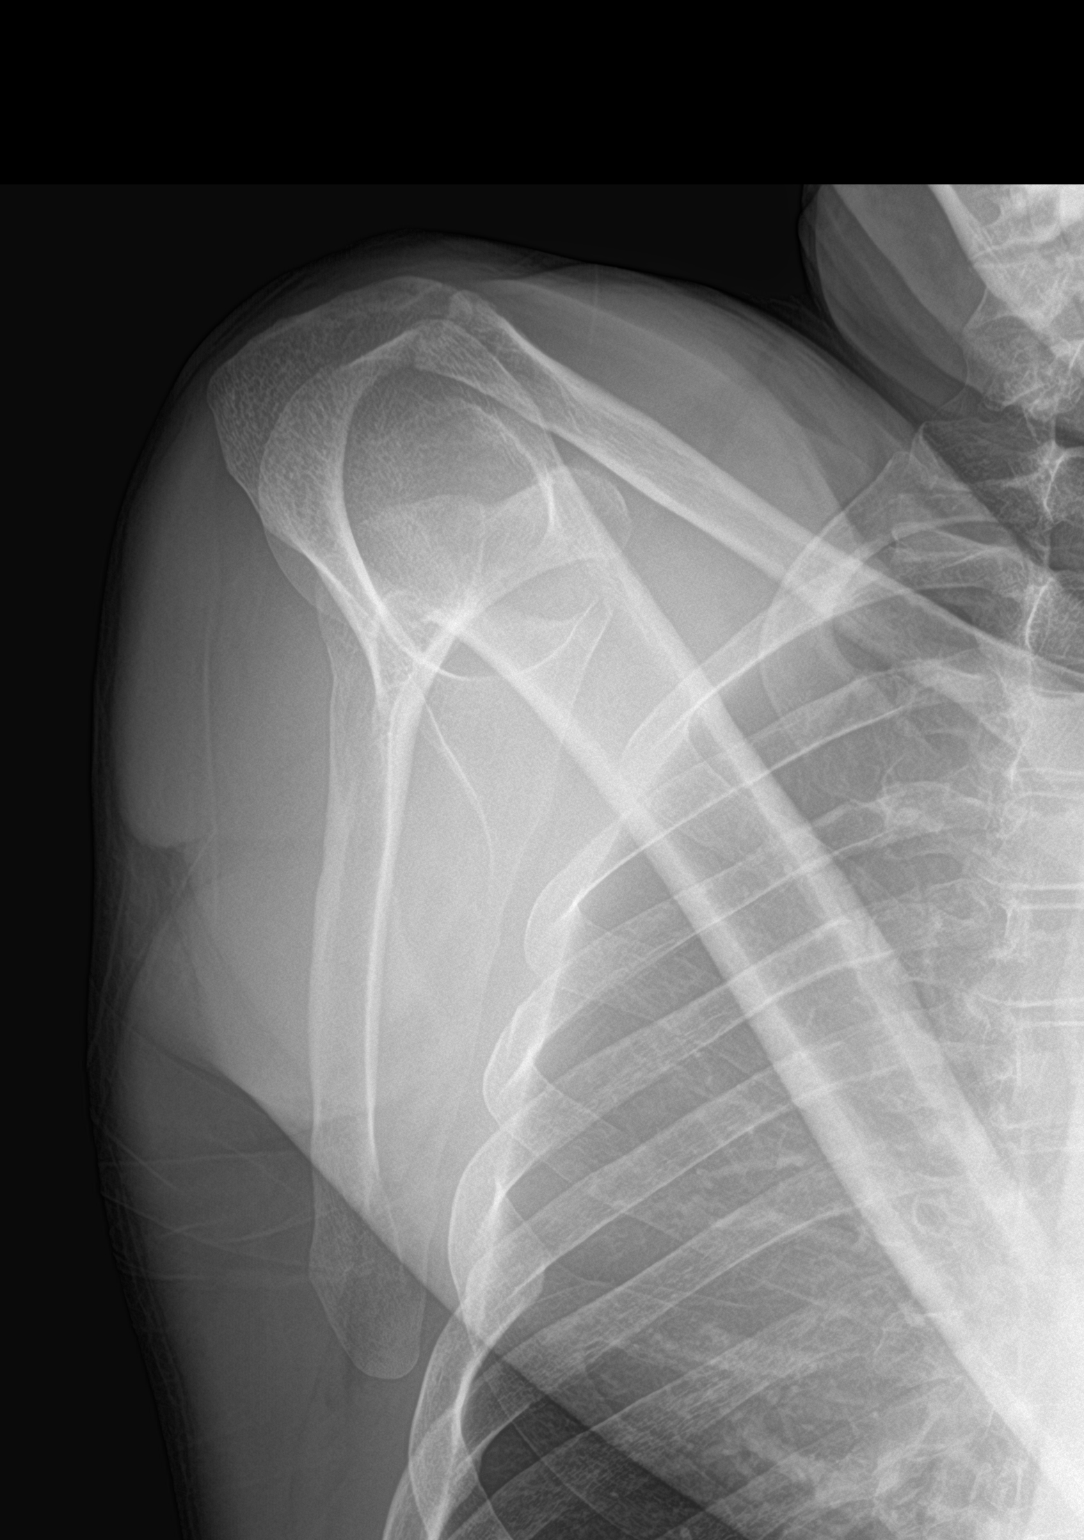
[im 3/3]
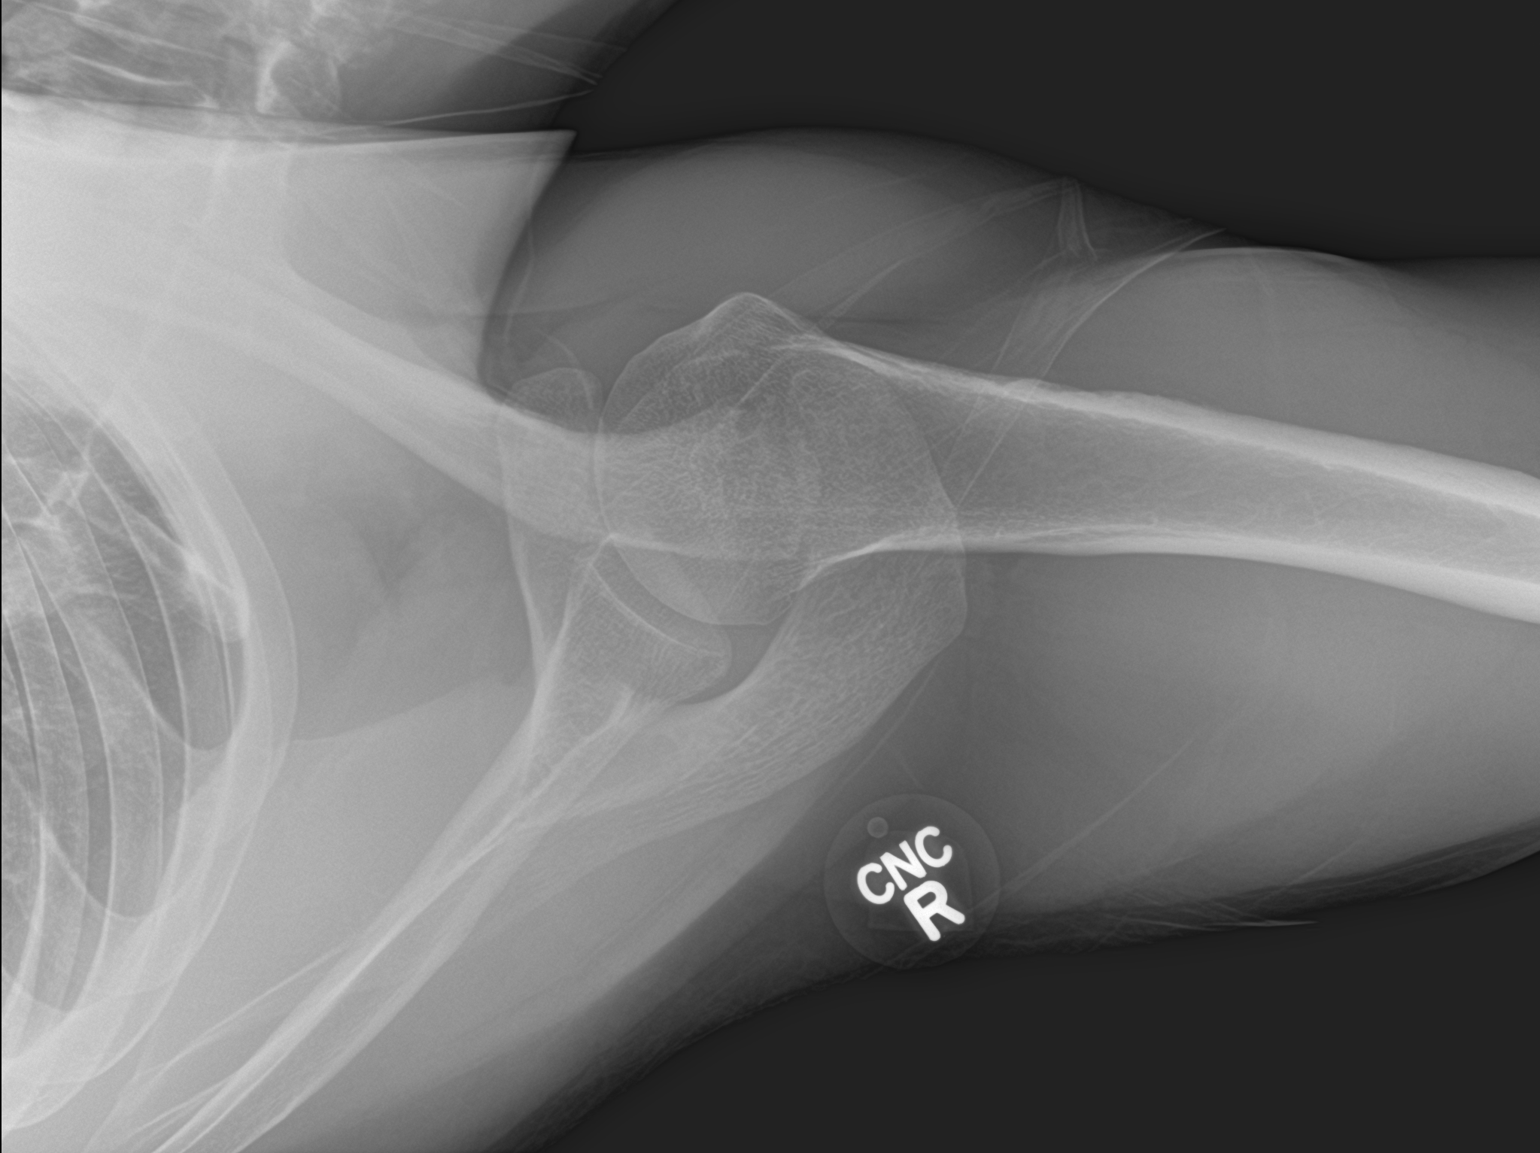

[3 of 3 positions shown; findings below may reference images not displayed]

FINDINGS: No acute fracture or dislocation is identified. Mild
acromioclavicular joint space narrowing is again noted with small
marginal osteophytes and subchondral cystic changes, perhaps mildly
progressed from the prior study. The soft tissues are unremarkable.
IMPRESSION: Mild acromioclavicular osteoarthrosis without acute osseous
abnormality.

## 2020-12-14 ENCOUNTER — Emergency Department
Admission: EM | Admit: 2020-12-14 | Discharge: 2020-12-14 | Disposition: A | Discharge status: Home / Self Care | Payer: Self-pay | Attending: Emergency Medicine | Admitting: Emergency Medicine

## 2020-12-14 ENCOUNTER — Other Ambulatory Visit: Payer: Self-pay

## 2020-12-14 DIAGNOSIS — F17210 Nicotine dependence, cigarettes, uncomplicated: Secondary | ICD-10-CM | POA: Insufficient documentation

## 2020-12-14 DIAGNOSIS — L309 Dermatitis, unspecified: Secondary | ICD-10-CM | POA: Insufficient documentation

## 2020-12-14 MED ORDER — LORATADINE 10 MG PO TABS
10.0000 mg | ORAL_TABLET | Freq: Once | ORAL | Status: AC
  Administered 2020-12-14: 10 mg via ORAL
  Filled 2020-12-14: qty 1

## 2020-12-14 NOTE — ED Provider Notes (Signed)
[facility] Emergency Department Provider Note  ____________________________________________   Event Date/Time   First MD Initiated Contact with Patient 12/14/20 1425     (approximate)  I have reviewed the triage vital signs and the nursing notes.   HISTORY  Chief Complaint Rash   HPI Juan Best is a 40 y.o. male past medical history of arthritis and GERD who presents for assessment of some itching redness of bilateral forearms extending up past the elbows and little bit in his lower legs that he noticed yesterday.  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  He does not recall being specifically brushing against any leaves in particular but thinks he may been exposed to some dust and plant matter.  He denies any headache, earache, sore throat, nausea, vomiting, diarrhea, dysuria, belly pain or any other acute sick symptoms.  He has not tried any medications for symptoms.  No other acute concerns at this time.         Past Medical History:  Diagnosis Date   Arthritis    GERD (gastroesophageal reflux disease)     There are no problems to display for this patient.   Past Surgical History:  Procedure Laterality Date   ANKLE SURGERY      Prior to Admission medications   Medication Sig Start Date End Date Taking? Authorizing Provider  albuterol (VENTOLIN HFA) 108 (90 Base) MCG/ACT inhaler Inhale 2 puffs into the lungs every 6 (six) hours as needed for wheezing or shortness of breath. 07/24/20   Tommi Rumps, PA-C  chlorpheniramine-HYDROcodone (TUSSIONEX PENNKINETIC ER) 10-8 MG/5ML SUER Take 5 mLs by mouth every 12 (twelve) hours as needed for cough. 07/24/20   Tommi Rumps, PA-C  predniSONE (DELTASONE) 10 MG tablet Take 5 tablets tomorrow, on day 2 take 4 tablets, day 3 take 3 tablets, day 4 take 2 tablets, day 5 take 1 tablets. 07/24/20   Tommi Rumps, PA-C     Allergies Poison ivy extract and Sulfa antibiotics  Family History  Problem Relation Age of Onset   Healthy Mother    Healthy Father     Social History Social History   Tobacco Use   Smoking status: Every Day    Packs/day: 0.50    Types: Cigarettes   Smokeless tobacco: Never  Vaping Use   Vaping Use: Never used  Substance Use Topics   Alcohol use: Yes   Drug use: No    Review of Systems  Review of Systems  Constitutional:  Negative for chills and fever.  HENT:  Negative for sore throat.   Eyes:  Negative for pain.  Respiratory:  Negative for cough and stridor.   Cardiovascular:  Negative for chest pain.  Gastrointestinal:  Negative for vomiting.  Skin:  Positive for itching and rash.  Neurological:  Negative for seizures, loss of consciousness and headaches.  Psychiatric/Behavioral:  Negative for suicidal ideas.   All other systems reviewed and are negative.    ____________________________________________   PHYSICAL EXAM:  VITAL SIGNS: ED Triage Vitals  Enc Vitals Group     BP 12/14/20 1226 116/76     Pulse Rate 12/14/20 1226 72     Resp 12/14/20 1226 20     Temp 12/14/20 1226 98.5 F (36.9 C)     Temp Source 12/14/20 1226 Oral     SpO2 12/14/20 1226 100 %  Weight 12/14/20 1227 135 lb (61.2 kg)     Height 12/14/20 1227 5\' 9"  (1.753 m)     Head Circumference --      Peak Flow --      Pain Score 12/14/20 1227 0     Pain Loc --      Pain Edu? --      Excl. in GC? --    Vitals:   12/14/20 1226  BP: 116/76  Pulse: 72  Resp: 20  Temp: 98.5 F (36.9 C)  SpO2: 100%   Physical Exam Vitals and nursing note reviewed.  Constitutional:      Appearance: He is well-developed.  HENT:     Head: Normocephalic and atraumatic.     Right Ear: External ear normal.     Left Ear: External ear normal.     Nose: Nose normal.  Eyes:     Conjunctiva/sclera: Conjunctivae normal.  Cardiovascular:     Rate and Rhythm: Normal rate and regular rhythm.      Heart sounds: No murmur heard. Pulmonary:     Effort: Pulmonary effort is normal. No respiratory distress.  Abdominal:     Palpations: Abdomen is soft.     Tenderness: There is no abdominal tenderness.  Musculoskeletal:     Cervical back: Neck supple.  Skin:    General: Skin is warm and dry.     Capillary Refill: Capillary refill takes less than 2 seconds.  Neurological:     Mental Status: He is alert and oriented to person, place, and time.  Psychiatric:        Mood and Affect: Mood normal.    Patient has some erythema over his bilateral forearms extending proximally bilaterally to discuss the elbows.  He is actively itching these areas on exam.  There is no pustular lesions abscess or blisters.  No flat macules there is a little bit of scaling. ____________________________________________   LABS (all labs ordered are listed, but only abnormal results are displayed)  Labs Reviewed - No data to display ____________________________________________  EKG  ____________________________________________  RADIOLOGY  ED MD interpretation:    Official radiology report(s): No results found.  ____________________________________________   PROCEDURES  Procedure(s) performed (including Critical Care):  Procedures   ____________________________________________   INITIAL IMPRESSION / ASSESSMENT AND PLAN / ED COURSE      Patient presents with above-stated history exam for assessment of some itchy red lesions on his forearms extending proximally from elbows up to the mid upper arms.  There is no clustering fluctuance edema pustular lesions or other fluid drainage.  He is otherwise neurovascular intact.  He has no constitutional symptoms and his oropharynx is unremarkable.  Suspect likely a contact dermatitis possibly related to plant matter versus other unclear allergen.  Low suspicion for cellulitis, SJS or anaphylaxis at this time.  No clear linear markings to suggest classic  poison ivy dermatitis.  Given spread is fairly limited to the forearms will defer systemic antibiotics at this time.  We will trial short course of nonsedating antihistamines of loratadine.  Advised patient to follow-up with his PCP.  Advised patient to keep his arms clean and cover longsleeve shirt stable or improved.  Discharged stable condition.  Strict return precautions advised and discussed      ____________________________________________   FINAL CLINICAL IMPRESSION(S) / ED DIAGNOSES  Final diagnoses:  Dermatitis    Medications  loratadine (CLARITIN) tablet 10 mg (has no administration in time range)     ED Discharge Orders  None        Note:  This document was prepared using Dragon voice recognition software and may include unintentional dictation errors.    Gilles Chiquito, MD 12/14/20 1434

## 2020-12-14 NOTE — ED Triage Notes (Signed)
Pt reports that he noticed a red rash on his body yesterday. 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 He just wants to make sure that it not anything else. He states that itches.

## 2020-12-14 NOTE — ED Notes (Signed)
See triage note  Presents with rash  States he noticed rash yesterday  No resp diff

## 2021-01-03 ENCOUNTER — Other Ambulatory Visit: Payer: Self-pay

## 2021-01-03 ENCOUNTER — Emergency Department
Admission: EM | Admit: 2021-01-03 | Discharge: 2021-01-03 | Disposition: A | Discharge status: Home / Self Care | Payer: Self-pay | Attending: Emergency Medicine | Admitting: Emergency Medicine

## 2021-01-03 ENCOUNTER — Emergency Department: Payer: Self-pay

## 2021-01-03 ENCOUNTER — Encounter: Payer: Self-pay | Admitting: Physician Assistant

## 2021-01-03 DIAGNOSIS — M25532 Pain in left wrist: Secondary | ICD-10-CM | POA: Insufficient documentation

## 2021-01-03 DIAGNOSIS — F17210 Nicotine dependence, cigarettes, uncomplicated: Secondary | ICD-10-CM | POA: Insufficient documentation

## 2021-01-03 DIAGNOSIS — S63502A Unspecified sprain of left wrist, initial encounter: Secondary | ICD-10-CM

## 2021-01-03 DIAGNOSIS — S6392XA Sprain of unspecified part of left wrist and hand, initial encounter: Secondary | ICD-10-CM | POA: Insufficient documentation

## 2021-01-03 MED ORDER — IBUPROFEN 800 MG PO TABS: 800.0000 mg | ORAL_TABLET | Freq: Three times a day (TID) | ORAL | 0 refills | Status: AC | PRN

## 2021-01-03 MED ORDER — HYDROCODONE-ACETAMINOPHEN 5-325 MG PO TABS: 1.0000 | ORAL_TABLET | Freq: Four times a day (QID) | ORAL | 0 refills | Status: DC | PRN

## 2021-01-03 MED ORDER — HYDROCODONE-ACETAMINOPHEN 5-325 MG PO TABS
1.0000 | ORAL_TABLET | Freq: Once | ORAL | Status: AC
  Administered 2021-01-03: 1 via ORAL
  Filled 2021-01-03: qty 1
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
## 2021-01-03 NOTE — Discharge Instructions (Addendum)
Your exam and XR are consistent with a wrist sprain. Wear the splint as needed for support. Apply ice to reduce pain and swelling. Take the anti-inflammatory as directed and the pain medicine as needed.

## 2021-01-31 ENCOUNTER — Emergency Department
Admission: EM | Admit: 2021-01-31 | Discharge: 2021-01-31 | Disposition: A | Discharge status: Home / Self Care | Payer: Self-pay | Attending: Emergency Medicine | Admitting: Emergency Medicine

## 2021-01-31 ENCOUNTER — Encounter: Payer: Self-pay | Admitting: Emergency Medicine

## 2021-01-31 ENCOUNTER — Emergency Department: Payer: Self-pay

## 2021-01-31 ENCOUNTER — Other Ambulatory Visit: Payer: Self-pay

## 2021-01-31 DIAGNOSIS — W010XXA Fall on same level from slipping, tripping and stumbling without subsequent striking against object, initial encounter: Secondary | ICD-10-CM | POA: Insufficient documentation

## 2021-01-31 DIAGNOSIS — F17210 Nicotine dependence, cigarettes, uncomplicated: Secondary | ICD-10-CM | POA: Insufficient documentation

## 2021-01-31 DIAGNOSIS — S52572A Other intraarticular fracture of lower end of left radius, initial encounter for closed fracture: Secondary | ICD-10-CM | POA: Insufficient documentation

## 2021-01-31 MED ORDER — HYDROCODONE-ACETAMINOPHEN 5-325 MG PO TABS: 1.0000 | ORAL_TABLET | Freq: Three times a day (TID) | ORAL | 0 refills | Status: DC | PRN

## 2021-01-31 NOTE — Discharge Instructions (Addendum)
Please wear Velcro wrist brace at all times.  Avoid lifting pushing and pulling with left upper extremity.  He may take Norco at nighttime as needed for severe pain.  Take Tylenol and ibuprofen during the day as needed for mild to moderate pain.  Please keep appointment with orthopedist on 02/08/2021, you may call them tomorrow and let them know that your initial x-rays were negative but x-rays today did show a fracture and they may want to see you sooner.
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
## 2021-02-17 ENCOUNTER — Emergency Department
Admission: EM | Admit: 2021-02-17 | Discharge: 2021-02-17 | Disposition: A | Discharge status: Home / Self Care | Payer: Self-pay | Attending: Emergency Medicine | Admitting: Emergency Medicine

## 2021-02-17 ENCOUNTER — Other Ambulatory Visit: Payer: Self-pay

## 2021-02-17 ENCOUNTER — Encounter: Payer: Self-pay | Admitting: Emergency Medicine

## 2021-02-17 ENCOUNTER — Emergency Department: Payer: Self-pay

## 2021-02-17 DIAGNOSIS — W19XXXA Unspecified fall, initial encounter: Secondary | ICD-10-CM

## 2021-02-17 DIAGNOSIS — F17210 Nicotine dependence, cigarettes, uncomplicated: Secondary | ICD-10-CM | POA: Insufficient documentation

## 2021-02-17 DIAGNOSIS — M25552 Pain in left hip: Secondary | ICD-10-CM | POA: Insufficient documentation

## 2021-02-17 DIAGNOSIS — R0781 Pleurodynia: Secondary | ICD-10-CM | POA: Insufficient documentation

## 2021-02-17 DIAGNOSIS — W010XXA Fall on same level from slipping, tripping and stumbling without subsequent striking against object, initial encounter: Secondary | ICD-10-CM | POA: Insufficient documentation

## 2021-02-17 MED ORDER — HYDROCODONE-ACETAMINOPHEN 5-325 MG PO TABS: 1.0000 | ORAL_TABLET | Freq: Four times a day (QID) | ORAL | 0 refills | Status: AC | PRN

## 2021-02-17 NOTE — Discharge Instructions (Addendum)
Patient is given 3-day course of pain medication.  You have been advised not to drive a motor vehicle while taking this medication. You were to follow-up with urgent care or this department if symptoms persist or worsen.

## 2021-02-17 NOTE — ED Provider Notes (Signed)
[facility]  [facility] Emergency Department Provider Note   ____________________________________________   Event Date/Time   First MD Initiated Contact with Patient 02/17/21 1238     (approximate)  I have reviewed the triage vital signs and the nursing notes.   HISTORY  Chief Complaint Hip Pain    HPI Juan Best is a 40 y.o. male patient reports to the emergency room after having a fall 4 days ago.  He reports that he was letting his dog out of the kennel/tripped and fell onto his left side onto the ground.  Since the fall he has been experiencing increasing pain in his left rib cage and left hip.  Patient reports that it is painful to take in a deep breath.  Patient is tender to left rib cage on palpation.  Breath sounds are clear to auscultation in all lobes.  Rib cage on exam is normal with no bony abnormality seen.  Patient has no shortening of left extremity.  Patient does have pain with medial and lateral rotation of the left hip.  He does not endorse pain with flexion or extension at the knee.  Patient is able to bear weight equally and ambulate as he has been working all week with these injuries.  Patient does endorse that the pain in the left hip is worse with ambulation and causes him to periodically limp.  He describes the pain in the left hip as a 5 out of 10 throbbing pain.  He reports the pain in his left rib cage as a 4 out of 10 sharp stabbing pain with inspiration.  Past Medical History:  Diagnosis Date   Arthritis    GERD (gastroesophageal reflux disease)     There are no problems to display for this patient.   Past Surgical History:  Procedure Laterality Date   ANKLE SURGERY      Prior to Admission medications   Medication Sig Start Date End Date Taking? Authorizing Provider  HYDROcodone-acetaminophen (NORCO/VICODIN) 5-325 MG tablet Take 1 tablet by mouth every 6 (six) hours as needed for up to 3 days for moderate pain. 02/17/21  02/20/21 Yes Herschell Dimes, NP  albuterol (VENTOLIN HFA) 108 (90 Base) MCG/ACT inhaler Inhale 2 puffs into the lungs every 6 (six) hours as needed for wheezing or shortness of breath. 07/24/20   Tommi Rumps, PA-C  ibuprofen (ADVIL) 800 MG tablet Take 1 tablet (800 mg total) by mouth every 8 (eight) hours as needed. 01/03/21   Menshew, Charlesetta Ivory, PA-C    Allergies Poison ivy extract and Sulfa antibiotics  Family History  Problem Relation Age of Onset   Healthy Mother    Healthy Father     Social History Social History   Tobacco Use   Smoking status: Every Day    Packs/day: 0.50    Types: Cigarettes   Smokeless tobacco: Never  Vaping Use   Vaping Use: Never used  Substance Use Topics   Alcohol use: Yes   Drug use: No    Review of Systems  Constitutional: No fever/chills Eyes: No visual changes.  Patient does have hemorrhage to left eye which he states is unassociated with fall. ENT: No sore throat. Cardiovascular: Denies chest pain. Respiratory: Denies shortness of breath. Gastrointestinal: No abdominal pain.  No nausea, no vomiting.  No diarrhea.  No constipation. Genitourinary: Negative for dysuria. Musculoskeletal: Negative for back pain.  Patient reports pain to left rib cage and left hip. Skin: Negative for rash. Neurological:  Negative for headaches, focal weakness or numbness.  ____________________________________________   PHYSICAL EXAM:  VITAL SIGNS: ED Triage Vitals  Enc Vitals Group     BP 02/17/21 1211 112/74     Pulse Rate 02/17/21 1211 67     Resp 02/17/21 1211 18     Temp 02/17/21 1211 98 F (36.7 C)     Temp src --      SpO2 02/17/21 1211 98 %     Weight --      Height --      Head Circumference --      Peak Flow --      Pain Score 02/17/21 1212 9     Pain Loc --      Pain Edu? --      Excl. in GC? --     Constitutional: Alert and oriented. Well appearing and in no acute distress. Eyes: Conjunctivae are normal. PERRL. EOMI.  left sclera noted to have hemorrhage and associated with fall. Head: Atraumatic. Nose: No congestion/rhinnorhea. Mouth/Throat: Mucous membranes are moist.  Oropharynx non-erythematous. Cardiovascular: Normal rate, regular rhythm. Grossly normal heart sounds.  Good peripheral circulation. Respiratory: Normal respiratory effort.  No retractions. Lungs CTAB.  Patient does endorse pain to the left rib cage with deep inspiration Gastrointestinal: Soft and nontender. No distention. No abdominal bruits. No CVA tenderness. Musculoskeletal: No lower extremity tenderness nor edema.  No joint effusions.  Patient does have pain with palpation to the left rib cage no bruising or bony abnormality noted.  Does have pain to the left hip with medial and lateral rotation of hip.  He does not have pain with flexion extension at the knee left hip. Neurologic:  Normal speech and language. No gross focal neurologic deficits are appreciated. No gait instability. Skin:  Skin is warm, dry and intact. No rash noted. Psychiatric: Mood and affect are normal. Speech and behavior are normal.  ____________________________________________   LABS (all labs ordered are listed, but only abnormal results are displayed)  Labs Reviewed - No data to display ____________________________________________  EKG   ____________________________________________  RADIOLOGY  ED MD interpretation: Chest x-ray and x-ray left hip were reviewed and agree with radiologist findings.  Official radiology report(s): DG Chest 2 View  Result Date: 02/17/2021 CLINICAL DATA:  rib cage pain after fall EXAM: CHEST - 2 VIEW COMPARISON:  July 24, 2020 FINDINGS: The cardiomediastinal silhouette is normal in contour. No pleural effusion. No pneumothorax. No acute pleuroparenchymal abnormality. Visualized abdomen is unremarkable. No acute osseous abnormality noted. Remote RIGHT rib fractures. IMPRESSION: No acute cardiopulmonary abnormality.  Electronically Signed   By: Meda Klinefelter M.D.   On: 02/17/2021 13:10   DG Hip Unilat W or Wo Pelvis 2-3 Views Left  Result Date: 02/17/2021 CLINICAL DATA:  rib cage pain after fall; leg pain EXAM: DG HIP (WITH OR WITHOUT PELVIS) 2-3V LEFT COMPARISON:  None. FINDINGS: No acute fracture or dislocation. LEFT-sided assimilation joint at L5-S1. Joint spaces and alignment are maintained. No area of erosion or osseous destruction. No unexpected radiopaque foreign body. Pelvic phleboliths. IMPRESSION: No acute fracture or dislocation. Electronically Signed   By: Meda Klinefelter M.D.   On: 02/17/2021 13:12    ____________________________________________   PROCEDURES  Procedure(s) performed: None  Procedures  Critical Care performed: No  ____________________________________________   INITIAL IMPRESSION / ASSESSMENT AND PLAN / ED COURSE    Patient reports to the emergency room complaining of fall 4 days ago.  Patient reports that he was letting his dog  out when he tripped and fell onto the ground landing on his left side/hip.  See HPI for further description of incident. Will obtain chest x-ray to evaluate rib pain. Will obtain left hip x-ray to evaluate for pain. Patient's chest x-ray and left hip x-ray are unremarkable. He will be discharged with 3 days worth of Norco #12 tablets. He is instructed not to drive while taking this medication. He is encouraged to deep breathe at least 4 times a day to prevent pneumonia. Declines work note. Patient is to follow-up with urgent care or this department if pain persist or worsens.     ____________________________________________   FINAL CLINICAL IMPRESSION(S) / ED DIAGNOSES  Final diagnoses:  Left hip pain  Rib pain on left side  Fall, initial encounter     ED Discharge Orders          Ordered    HYDROcodone-acetaminophen (NORCO/VICODIN) 5-325 MG tablet  Every 6 hours PRN        02/17/21 1348             Note:   This document was prepared using Dragon voice recognition software and may include unintentional dictation errors.     Herschell Dimes, NP 02/17/21 1352    Merwyn Katos, MD 02/17/21 [phone]

## 2021-02-17 NOTE — ED Notes (Signed)
Pt given water. Pt given rx information and followup information. Pt off unit on foot with no questions and verbal consent for DC provided.

## 2021-02-17 NOTE — ED Triage Notes (Signed)
Pt comes with c/o left side rib and hip pain after trip and fall few days ago.

## 2021-09-08 ENCOUNTER — Other Ambulatory Visit: Payer: Self-pay

## 2021-09-08 DIAGNOSIS — R519 Headache, unspecified: Secondary | ICD-10-CM | POA: Insufficient documentation

## 2021-09-08 DIAGNOSIS — Z23 Encounter for immunization: Secondary | ICD-10-CM | POA: Insufficient documentation

## 2021-09-08 DIAGNOSIS — S01511A Laceration without foreign body of lip, initial encounter: Secondary | ICD-10-CM | POA: Insufficient documentation

## 2021-09-08 LAB — URINE DRUG SCREEN, QUALITATIVE (ARMC ONLY)
Amphetamines, Ur Screen: NOT DETECTED
Barbiturates, Ur Screen: NOT DETECTED
Benzodiazepine, Ur Scrn: NOT DETECTED
Cannabinoid 50 Ng, Ur ~~LOC~~: POSITIVE — AB
Cocaine Metabolite,Ur ~~LOC~~: POSITIVE — AB
MDMA (Ecstasy)Ur Screen: NOT DETECTED
Methadone Scn, Ur: NOT DETECTED
Opiate, Ur Screen: NOT DETECTED
Phencyclidine (PCP) Ur S: NOT DETECTED
Tricyclic, Ur Screen: NOT DETECTED
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
## 2021-09-09 ENCOUNTER — Emergency Department
Admission: EM | Admit: 2021-09-09 | Discharge: 2021-09-09 | Disposition: A | Discharge status: Home / Self Care | Payer: Self-pay | Attending: Emergency Medicine | Admitting: Emergency Medicine

## 2021-09-09 ENCOUNTER — Emergency Department: Payer: Self-pay

## 2021-09-09 DIAGNOSIS — S0990XA Unspecified injury of head, initial encounter: Secondary | ICD-10-CM

## 2021-09-09 DIAGNOSIS — S01511A Laceration without foreign body of lip, initial encounter: Secondary | ICD-10-CM

## 2021-09-09 MED ORDER — LIDOCAINE HCL (PF) 1 % IJ SOLN
5.0000 mL | Freq: Once | INTRAMUSCULAR | Status: AC
  Administered 2021-09-09: 5 mL
  Filled 2021-09-09: qty 5

## 2021-09-09 MED ORDER — BACITRACIN ZINC 500 UNIT/GM EX OINT
TOPICAL_OINTMENT | Freq: Once | CUTANEOUS | Status: AC
  Filled 2021-09-09: qty 1.8

## 2021-09-09 MED ORDER — TETANUS-DIPHTH-ACELL PERTUSSIS 5-2.5-18.5 LF-MCG/0.5 IM SUSY
0.5000 mL | PREFILLED_SYRINGE | Freq: Once | INTRAMUSCULAR | Status: AC
  Administered 2021-09-09: 0.5 mL via INTRAMUSCULAR
  Filled 2021-09-09: qty 0.5

## 2021-09-09 MED ORDER — ACETAMINOPHEN 500 MG PO TABS
1000.0000 mg | ORAL_TABLET | Freq: Once | ORAL | Status: AC
  Administered 2021-09-09: 1000 mg via ORAL
  Filled 2021-09-09: qty 2

## 2021-09-09 NOTE — Discharge Instructions (Addendum)
You may alternate Tylenol 1000 mg every 6 hours as needed for pain, fever and Ibuprofen 800 mg every 6-8 hours as needed for pain, fever.  Please take Ibuprofen with food.  Do not take more than 4000 mg of Tylenol (acetaminophen) in a 24 hour period. ? ?Your sutures in your lip are absorbable and will come out on their own.  You may shower with these sutures and clean very gently and apply over the counter neosporin as needed. ? ? ?CT of your head, neck and face showed no fractures or bleeding. ? ?Steps to find a Primary Care Provider (PCP): ? ?Call [phone] or [phone] to access "[facility] Find a Doctor Service." ? ?2.  You may also go on the [facility] website at [email or web address] ? ? ? ?

## 2021-09-09 NOTE — ED Notes (Signed)
Pt moved to triage 4 for procedure ?
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
## 2021-09-09 NOTE — ED Notes (Signed)
Pt is in CT
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
